# Patient Record
Sex: Female | Born: 1935 | Race: White | Hispanic: No | Smoking: Never smoker
Health system: Southern US, Community
[De-identification: ages and names within clinical notes are randomized; demographics above are authoritative.]

## PROBLEM LIST (undated history)

## (undated) DIAGNOSIS — M25569 Pain in unspecified knee: Secondary | ICD-10-CM

## (undated) DIAGNOSIS — M19049 Primary osteoarthritis, unspecified hand: Secondary | ICD-10-CM

## (undated) DIAGNOSIS — E785 Hyperlipidemia, unspecified: Secondary | ICD-10-CM

## (undated) DIAGNOSIS — M47817 Spondylosis without myelopathy or radiculopathy, lumbosacral region: Secondary | ICD-10-CM

## (undated) DIAGNOSIS — I350 Nonrheumatic aortic (valve) stenosis: Secondary | ICD-10-CM

## (undated) DIAGNOSIS — R002 Palpitations: Secondary | ICD-10-CM

## (undated) DIAGNOSIS — M47816 Spondylosis without myelopathy or radiculopathy, lumbar region: Secondary | ICD-10-CM

## (undated) DIAGNOSIS — C50919 Malignant neoplasm of unspecified site of unspecified female breast: Secondary | ICD-10-CM

## (undated) DIAGNOSIS — R011 Cardiac murmur, unspecified: Secondary | ICD-10-CM

## (undated) DIAGNOSIS — I10 Essential (primary) hypertension: Secondary | ICD-10-CM

## (undated) HISTORY — DX: Spondylosis without myelopathy or radiculopathy, lumbar region: M47.816

## (undated) HISTORY — PX: APPENDECTOMY: SHX54

## (undated) HISTORY — PX: HIATAL HERNIA REPAIR: SHX195

## (undated) HISTORY — DX: Essential (primary) hypertension: I10

## (undated) HISTORY — PX: CHOLECYSTECTOMY: SHX55

## (undated) HISTORY — DX: Pain in unspecified knee: M25.569

## (undated) HISTORY — PX: SPINE SURGERY: SHX786

## (undated) HISTORY — DX: Nonrheumatic aortic (valve) stenosis: I35.0

## (undated) HISTORY — PX: TONSILLECTOMY: SUR1361

## (undated) HISTORY — DX: Spondylosis without myelopathy or radiculopathy, lumbosacral region: M47.817

## (undated) HISTORY — DX: Primary osteoarthritis, unspecified hand: M19.049

## (undated) HISTORY — PX: BREAST SURGERY: SHX581

## (undated) HISTORY — DX: Cardiac murmur, unspecified: R01.1

## (undated) HISTORY — DX: Hyperlipidemia, unspecified: E78.5

## (undated) HISTORY — PX: ABDOMINAL HYSTERECTOMY: SHX81

## (undated) HISTORY — DX: Palpitations: R00.2

---

## 1998-01-30 ENCOUNTER — Inpatient Hospital Stay (HOSPITAL_COMMUNITY): Admission: RE | Admit: 1998-01-30 | Discharge: 1998-02-01 | Payer: Self-pay | Admitting: Neurosurgery

## 2000-10-19 ENCOUNTER — Ambulatory Visit (HOSPITAL_COMMUNITY): Admission: RE | Admit: 2000-10-19 | Discharge: 2000-10-19 | Payer: Self-pay | Admitting: Neurosurgery

## 2000-11-02 ENCOUNTER — Ambulatory Visit (HOSPITAL_COMMUNITY): Admission: RE | Admit: 2000-11-02 | Discharge: 2000-11-02 | Payer: Self-pay | Admitting: Neurosurgery

## 2000-11-11 ENCOUNTER — Encounter: Payer: Self-pay | Admitting: Family Medicine

## 2000-11-11 ENCOUNTER — Ambulatory Visit (HOSPITAL_COMMUNITY): Admission: RE | Admit: 2000-11-11 | Discharge: 2000-11-11 | Payer: Self-pay | Admitting: Cardiology

## 2001-01-31 ENCOUNTER — Ambulatory Visit (HOSPITAL_COMMUNITY): Admission: RE | Admit: 2001-01-31 | Discharge: 2001-01-31 | Payer: Self-pay | Admitting: Family Medicine

## 2001-01-31 ENCOUNTER — Encounter: Payer: Self-pay | Admitting: Family Medicine

## 2001-08-23 ENCOUNTER — Encounter: Admission: RE | Admit: 2001-08-23 | Discharge: 2001-08-23 | Payer: Self-pay | Admitting: Neurosurgery

## 2001-09-21 ENCOUNTER — Encounter: Admission: RE | Admit: 2001-09-21 | Discharge: 2001-09-21 | Payer: Self-pay | Admitting: Neurosurgery

## 2001-10-06 ENCOUNTER — Encounter: Admission: RE | Admit: 2001-10-06 | Discharge: 2001-10-06 | Payer: Self-pay | Admitting: Neurosurgery

## 2002-01-19 ENCOUNTER — Ambulatory Visit (HOSPITAL_COMMUNITY): Admission: RE | Admit: 2002-01-19 | Discharge: 2002-01-19 | Payer: Self-pay | Admitting: Ophthalmology

## 2002-02-05 ENCOUNTER — Ambulatory Visit (HOSPITAL_COMMUNITY): Admission: RE | Admit: 2002-02-05 | Discharge: 2002-02-05 | Payer: Self-pay | Admitting: Family Medicine

## 2002-02-05 ENCOUNTER — Encounter: Payer: Self-pay | Admitting: Family Medicine

## 2002-03-20 ENCOUNTER — Ambulatory Visit (HOSPITAL_COMMUNITY): Admission: RE | Admit: 2002-03-20 | Discharge: 2002-03-20 | Payer: Self-pay | Admitting: Ophthalmology

## 2002-05-01 ENCOUNTER — Encounter: Admission: RE | Admit: 2002-05-01 | Discharge: 2002-05-01 | Payer: Self-pay | Admitting: Neurosurgery

## 2002-05-15 ENCOUNTER — Encounter: Admission: RE | Admit: 2002-05-15 | Discharge: 2002-05-15 | Payer: Self-pay | Admitting: Neurosurgery

## 2002-05-21 ENCOUNTER — Ambulatory Visit (HOSPITAL_COMMUNITY): Admission: RE | Admit: 2002-05-21 | Discharge: 2002-05-21 | Payer: Self-pay | Admitting: Internal Medicine

## 2002-08-17 ENCOUNTER — Encounter: Payer: Self-pay | Admitting: Radiology

## 2002-08-17 ENCOUNTER — Encounter: Admission: RE | Admit: 2002-08-17 | Discharge: 2002-08-17 | Payer: Self-pay | Admitting: Radiology

## 2002-08-17 ENCOUNTER — Encounter: Admission: RE | Admit: 2002-08-17 | Discharge: 2002-08-17 | Payer: Self-pay | Admitting: Neurosurgery

## 2002-09-10 ENCOUNTER — Ambulatory Visit (HOSPITAL_COMMUNITY): Admission: RE | Admit: 2002-09-10 | Discharge: 2002-09-10 | Payer: Self-pay | Admitting: Ophthalmology

## 2003-01-21 ENCOUNTER — Encounter: Payer: Self-pay | Admitting: Radiology

## 2003-01-21 ENCOUNTER — Encounter: Admission: RE | Admit: 2003-01-21 | Discharge: 2003-01-21 | Payer: Self-pay | Admitting: Neurosurgery

## 2003-05-30 ENCOUNTER — Encounter
Admission: RE | Admit: 2003-05-30 | Discharge: 2003-08-28 | Payer: Self-pay | Admitting: Physical Medicine & Rehabilitation

## 2003-06-03 ENCOUNTER — Encounter (HOSPITAL_COMMUNITY)
Admission: RE | Admit: 2003-06-03 | Discharge: 2003-07-03 | Payer: Self-pay | Admitting: Physical Medicine & Rehabilitation

## 2003-07-04 ENCOUNTER — Encounter (HOSPITAL_COMMUNITY)
Admission: RE | Admit: 2003-07-04 | Discharge: 2003-08-03 | Payer: Self-pay | Admitting: Physical Medicine & Rehabilitation

## 2003-10-18 ENCOUNTER — Encounter
Admission: RE | Admit: 2003-10-18 | Discharge: 2004-01-16 | Payer: Self-pay | Admitting: Physical Medicine & Rehabilitation

## 2004-01-17 ENCOUNTER — Encounter
Admission: RE | Admit: 2004-01-17 | Discharge: 2004-04-16 | Payer: Self-pay | Admitting: Physical Medicine & Rehabilitation

## 2004-05-14 ENCOUNTER — Inpatient Hospital Stay (HOSPITAL_COMMUNITY): Admission: EM | Admit: 2004-05-14 | Discharge: 2004-05-18 | Payer: Self-pay | Admitting: Internal Medicine

## 2004-07-02 ENCOUNTER — Encounter
Admission: RE | Admit: 2004-07-02 | Discharge: 2004-09-30 | Payer: Self-pay | Admitting: Physical Medicine & Rehabilitation

## 2004-07-06 ENCOUNTER — Ambulatory Visit: Payer: Self-pay | Admitting: Physical Medicine & Rehabilitation

## 2004-09-17 ENCOUNTER — Ambulatory Visit: Payer: Self-pay | Admitting: Physical Medicine & Rehabilitation

## 2004-11-18 ENCOUNTER — Encounter
Admission: RE | Admit: 2004-11-18 | Discharge: 2005-02-16 | Payer: Self-pay | Admitting: Physical Medicine & Rehabilitation

## 2004-11-19 ENCOUNTER — Ambulatory Visit: Payer: Self-pay | Admitting: Physical Medicine & Rehabilitation

## 2004-12-22 ENCOUNTER — Ambulatory Visit (HOSPITAL_COMMUNITY): Admission: RE | Admit: 2004-12-22 | Discharge: 2004-12-22 | Payer: Self-pay | Admitting: Family Medicine

## 2005-01-01 ENCOUNTER — Ambulatory Visit (HOSPITAL_COMMUNITY): Admission: RE | Admit: 2005-01-01 | Discharge: 2005-01-01 | Payer: Self-pay | Admitting: Family Medicine

## 2005-01-14 ENCOUNTER — Ambulatory Visit: Payer: Self-pay | Admitting: Physical Medicine & Rehabilitation

## 2005-04-13 ENCOUNTER — Ambulatory Visit: Payer: Self-pay | Admitting: Internal Medicine

## 2005-05-05 ENCOUNTER — Encounter
Admission: RE | Admit: 2005-05-05 | Discharge: 2005-08-03 | Payer: Self-pay | Admitting: Physical Medicine & Rehabilitation

## 2005-05-05 ENCOUNTER — Ambulatory Visit: Payer: Self-pay | Admitting: Physical Medicine & Rehabilitation

## 2005-07-04 ENCOUNTER — Emergency Department (HOSPITAL_COMMUNITY): Admission: EM | Admit: 2005-07-04 | Discharge: 2005-07-04 | Payer: Self-pay | Admitting: Emergency Medicine

## 2005-08-23 ENCOUNTER — Ambulatory Visit: Payer: Self-pay | Admitting: Physical Medicine & Rehabilitation

## 2005-08-23 ENCOUNTER — Encounter
Admission: RE | Admit: 2005-08-23 | Discharge: 2005-11-21 | Payer: Self-pay | Admitting: Physical Medicine & Rehabilitation

## 2005-10-11 ENCOUNTER — Encounter (INDEPENDENT_AMBULATORY_CARE_PROVIDER_SITE_OTHER): Payer: Self-pay | Admitting: Diagnostic Radiology

## 2005-10-11 ENCOUNTER — Encounter (INDEPENDENT_AMBULATORY_CARE_PROVIDER_SITE_OTHER): Payer: Self-pay | Admitting: *Deleted

## 2005-10-11 ENCOUNTER — Encounter: Admission: RE | Admit: 2005-10-11 | Discharge: 2005-10-11 | Payer: Self-pay | Admitting: General Surgery

## 2005-10-21 ENCOUNTER — Encounter: Admission: RE | Admit: 2005-10-21 | Discharge: 2005-10-21 | Payer: Self-pay | Admitting: General Surgery

## 2005-10-25 ENCOUNTER — Ambulatory Visit (HOSPITAL_BASED_OUTPATIENT_CLINIC_OR_DEPARTMENT_OTHER): Admission: RE | Admit: 2005-10-25 | Discharge: 2005-10-25 | Payer: Self-pay | Admitting: General Surgery

## 2005-10-25 ENCOUNTER — Encounter (INDEPENDENT_AMBULATORY_CARE_PROVIDER_SITE_OTHER): Payer: Self-pay | Admitting: Specialist

## 2006-01-27 ENCOUNTER — Encounter
Admission: RE | Admit: 2006-01-27 | Discharge: 2006-04-27 | Payer: Self-pay | Admitting: Physical Medicine & Rehabilitation

## 2006-01-28 ENCOUNTER — Ambulatory Visit: Payer: Self-pay | Admitting: Physical Medicine & Rehabilitation

## 2006-04-27 ENCOUNTER — Encounter
Admission: RE | Admit: 2006-04-27 | Discharge: 2006-07-26 | Payer: Self-pay | Admitting: Physical Medicine & Rehabilitation

## 2006-04-27 ENCOUNTER — Ambulatory Visit: Payer: Self-pay | Admitting: Physical Medicine & Rehabilitation

## 2006-06-01 ENCOUNTER — Ambulatory Visit: Payer: Self-pay | Admitting: Internal Medicine

## 2006-08-03 ENCOUNTER — Encounter
Admission: RE | Admit: 2006-08-03 | Discharge: 2006-11-01 | Payer: Self-pay | Admitting: Physical Medicine & Rehabilitation

## 2006-08-03 ENCOUNTER — Ambulatory Visit: Payer: Self-pay | Admitting: Physical Medicine & Rehabilitation

## 2006-08-08 ENCOUNTER — Encounter: Admission: RE | Admit: 2006-08-08 | Discharge: 2006-08-08 | Payer: Self-pay | Admitting: General Surgery

## 2006-09-07 ENCOUNTER — Encounter
Admission: RE | Admit: 2006-09-07 | Discharge: 2006-12-06 | Payer: Self-pay | Admitting: Physical Medicine & Rehabilitation

## 2006-09-12 ENCOUNTER — Ambulatory Visit (HOSPITAL_COMMUNITY): Admission: RE | Admit: 2006-09-12 | Discharge: 2006-09-12 | Payer: Self-pay | Admitting: Ophthalmology

## 2006-09-26 ENCOUNTER — Ambulatory Visit (HOSPITAL_COMMUNITY): Admission: RE | Admit: 2006-09-26 | Discharge: 2006-09-26 | Payer: Self-pay | Admitting: Ophthalmology

## 2006-11-03 ENCOUNTER — Ambulatory Visit: Payer: Self-pay | Admitting: Physical Medicine & Rehabilitation

## 2006-12-22 ENCOUNTER — Encounter
Admission: RE | Admit: 2006-12-22 | Discharge: 2007-03-22 | Payer: Self-pay | Admitting: Physical Medicine & Rehabilitation

## 2007-01-12 ENCOUNTER — Ambulatory Visit: Payer: Self-pay | Admitting: Physical Medicine & Rehabilitation

## 2007-02-05 IMAGING — CR DG WRIST COMPLETE 3+V*L*
4 series · 4 of 4 positions shown · non-contrast
Comparison: none

CLINICAL DATA: Fell three months ago. 
 LEFT WRIST FOUR VIEWS:
 There is generalized osteopenia.  Normal alignment.  No fracture.  Mild degenerative change in the 1st carpal metacarpal joint.  Otherwise no significant degenerative change.

[x wrist pa left]
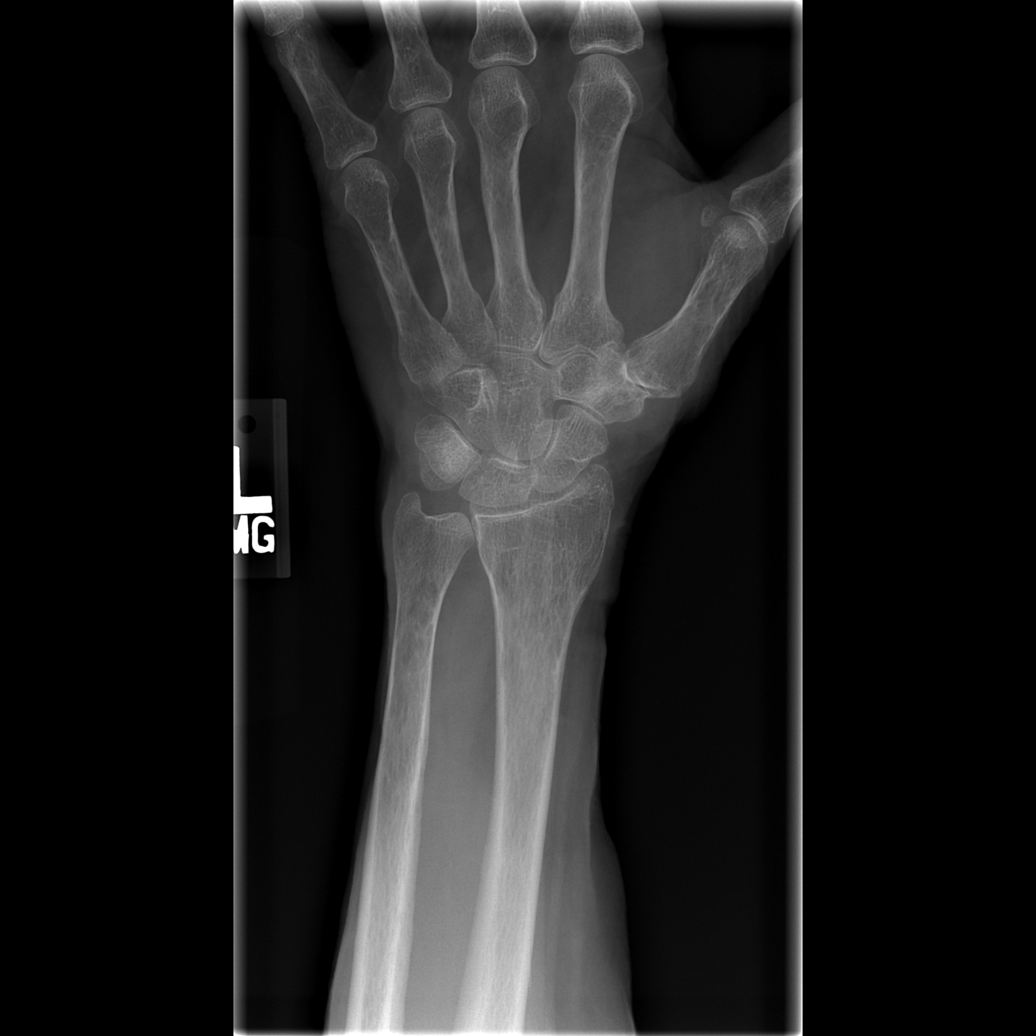

[x wrist obl left]
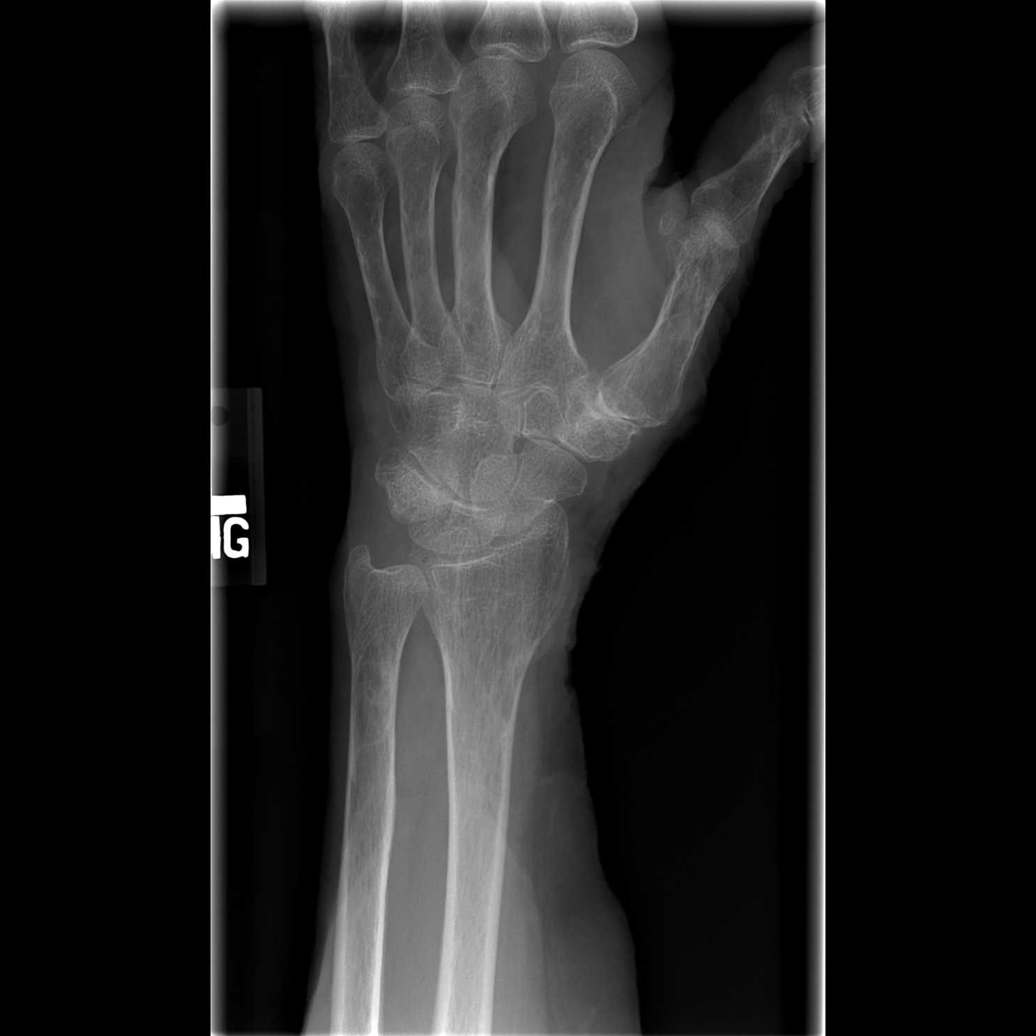

[x wrist lat left]
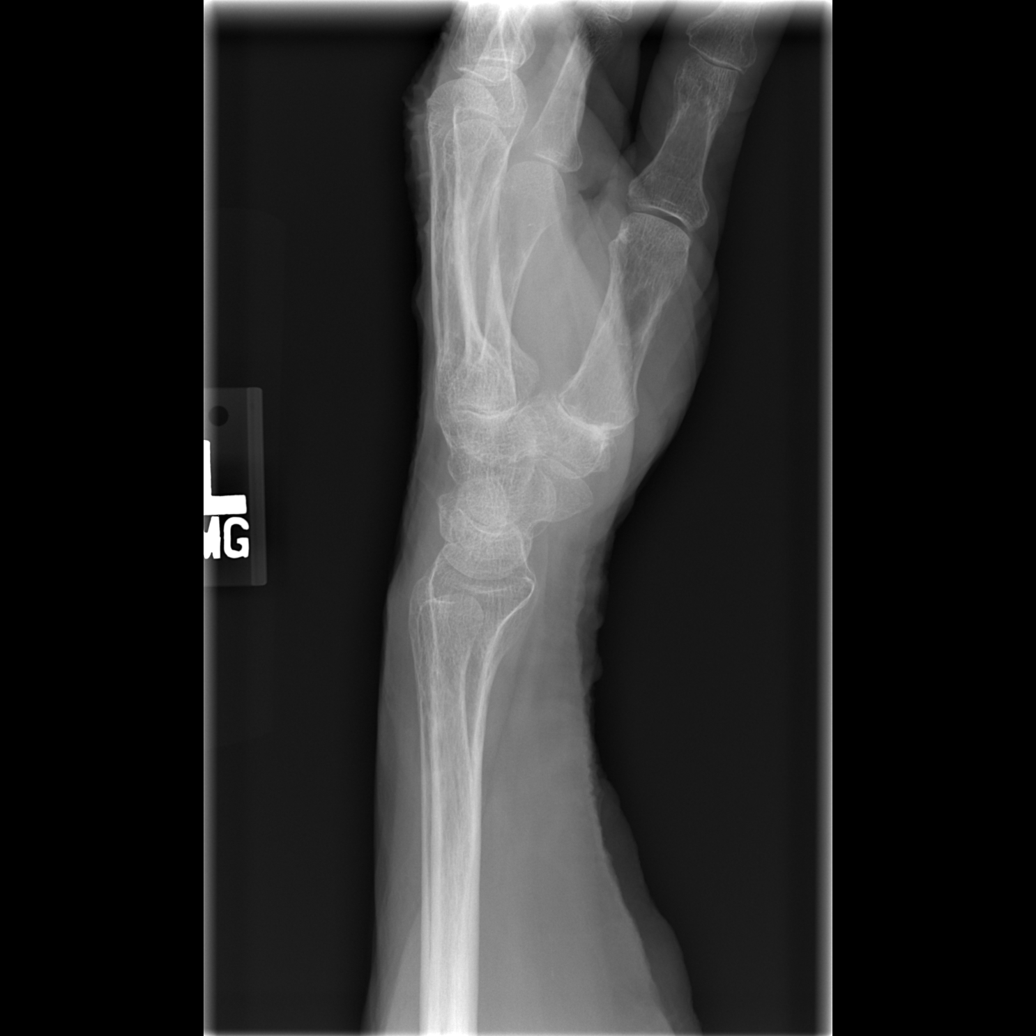

[x navicular]
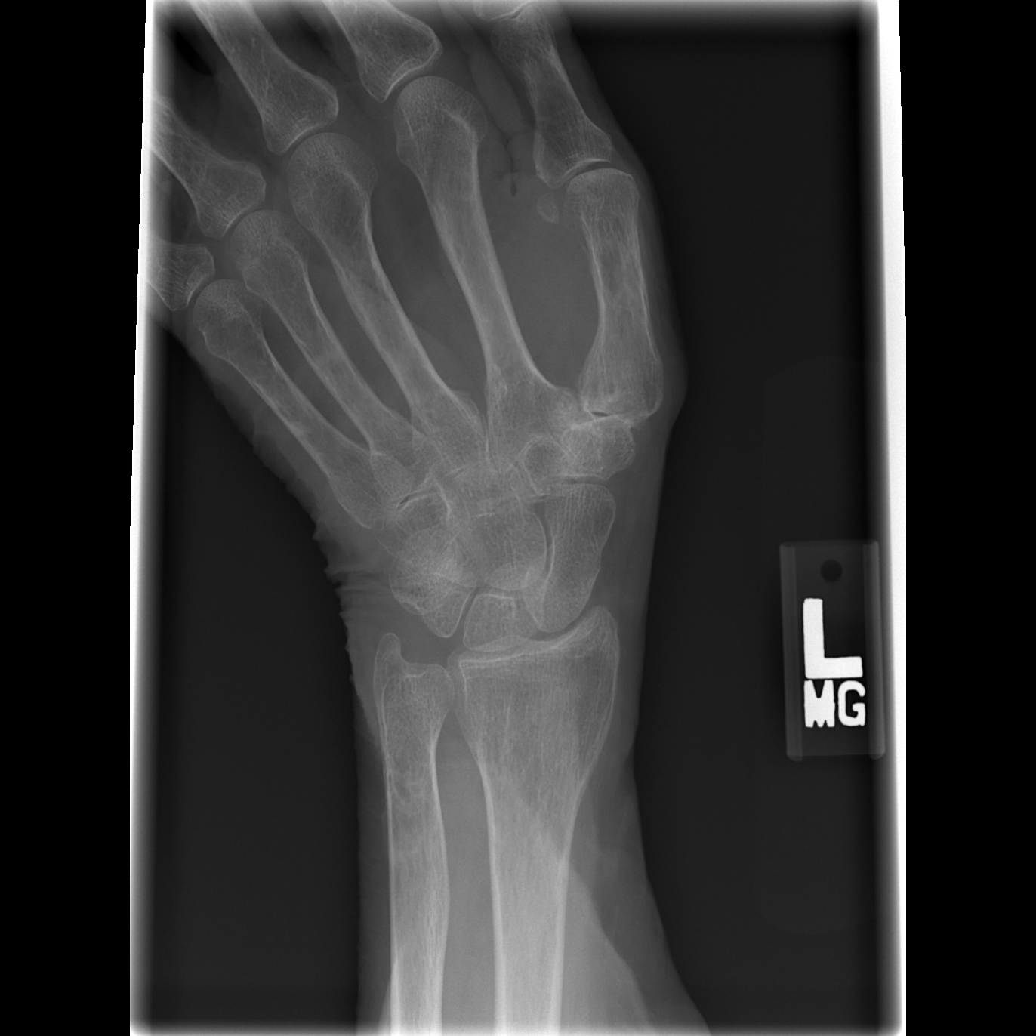

[4 of 4 positions shown; findings below may reference images not displayed]

IMPRESSION: No acute abnormality.

## 2007-03-24 ENCOUNTER — Encounter
Admission: RE | Admit: 2007-03-24 | Discharge: 2007-06-22 | Payer: Self-pay | Admitting: Physical Medicine & Rehabilitation

## 2007-03-27 ENCOUNTER — Ambulatory Visit: Payer: Self-pay | Admitting: Physical Medicine & Rehabilitation

## 2007-06-01 ENCOUNTER — Ambulatory Visit: Payer: Self-pay | Admitting: Physical Medicine & Rehabilitation

## 2007-07-27 ENCOUNTER — Encounter
Admission: RE | Admit: 2007-07-27 | Discharge: 2007-07-29 | Payer: Self-pay | Admitting: Physical Medicine & Rehabilitation

## 2007-07-27 ENCOUNTER — Ambulatory Visit: Payer: Self-pay | Admitting: Physical Medicine & Rehabilitation

## 2007-08-07 ENCOUNTER — Encounter (HOSPITAL_COMMUNITY)
Admission: RE | Admit: 2007-08-07 | Discharge: 2007-09-06 | Payer: Self-pay | Admitting: Physical Medicine & Rehabilitation

## 2007-09-21 ENCOUNTER — Encounter
Admission: RE | Admit: 2007-09-21 | Discharge: 2007-09-22 | Payer: Self-pay | Admitting: Physical Medicine & Rehabilitation

## 2007-09-21 ENCOUNTER — Ambulatory Visit: Payer: Self-pay | Admitting: Physical Medicine & Rehabilitation

## 2007-10-13 ENCOUNTER — Ambulatory Visit (HOSPITAL_COMMUNITY): Admission: RE | Admit: 2007-10-13 | Discharge: 2007-10-13 | Payer: Self-pay | Admitting: Family Medicine

## 2007-12-07 ENCOUNTER — Encounter
Admission: RE | Admit: 2007-12-07 | Discharge: 2008-03-06 | Payer: Self-pay | Admitting: Physical Medicine & Rehabilitation

## 2007-12-21 ENCOUNTER — Ambulatory Visit: Payer: Self-pay | Admitting: Physical Medicine & Rehabilitation

## 2008-01-26 ENCOUNTER — Ambulatory Visit: Payer: Self-pay | Admitting: Physical Medicine & Rehabilitation

## 2008-03-14 ENCOUNTER — Ambulatory Visit (HOSPITAL_COMMUNITY): Admission: RE | Admit: 2008-03-14 | Discharge: 2008-03-14 | Payer: Self-pay | Admitting: Family Medicine

## 2008-03-20 ENCOUNTER — Encounter: Payer: Self-pay | Admitting: Orthopedic Surgery

## 2008-03-20 ENCOUNTER — Emergency Department (HOSPITAL_COMMUNITY): Admission: EM | Admit: 2008-03-20 | Discharge: 2008-03-20 | Payer: Self-pay | Admitting: Emergency Medicine

## 2008-03-25 ENCOUNTER — Telehealth: Payer: Self-pay | Admitting: Orthopedic Surgery

## 2008-03-25 ENCOUNTER — Ambulatory Visit: Payer: Self-pay | Admitting: Orthopedic Surgery

## 2008-03-25 DIAGNOSIS — S52209A Unspecified fracture of shaft of unspecified ulna, initial encounter for closed fracture: Secondary | ICD-10-CM

## 2008-03-28 ENCOUNTER — Encounter: Payer: Self-pay | Admitting: Orthopedic Surgery

## 2008-04-10 ENCOUNTER — Encounter: Payer: Self-pay | Admitting: Orthopedic Surgery

## 2008-04-17 ENCOUNTER — Encounter
Admission: RE | Admit: 2008-04-17 | Discharge: 2008-07-12 | Payer: Self-pay | Admitting: Physical Medicine & Rehabilitation

## 2008-04-19 ENCOUNTER — Ambulatory Visit: Payer: Self-pay | Admitting: Physical Medicine & Rehabilitation

## 2008-04-22 ENCOUNTER — Ambulatory Visit: Payer: Self-pay | Admitting: Orthopedic Surgery

## 2008-05-17 ENCOUNTER — Ambulatory Visit: Payer: Self-pay | Admitting: Physical Medicine & Rehabilitation

## 2008-05-22 ENCOUNTER — Ambulatory Visit: Payer: Self-pay | Admitting: Orthopedic Surgery

## 2008-07-12 ENCOUNTER — Encounter: Payer: Self-pay | Admitting: Orthopedic Surgery

## 2008-07-12 ENCOUNTER — Ambulatory Visit: Payer: Self-pay | Admitting: Physical Medicine & Rehabilitation

## 2008-07-30 ENCOUNTER — Ambulatory Visit (HOSPITAL_COMMUNITY): Admission: RE | Admit: 2008-07-30 | Discharge: 2008-07-30 | Payer: Self-pay | Admitting: Orthopedic Surgery

## 2008-07-30 ENCOUNTER — Encounter: Payer: Self-pay | Admitting: Orthopedic Surgery

## 2008-07-31 ENCOUNTER — Ambulatory Visit: Payer: Self-pay | Admitting: Orthopedic Surgery

## 2008-07-31 DIAGNOSIS — M76899 Other specified enthesopathies of unspecified lower limb, excluding foot: Secondary | ICD-10-CM | POA: Insufficient documentation

## 2008-07-31 DIAGNOSIS — M25569 Pain in unspecified knee: Secondary | ICD-10-CM

## 2008-08-09 ENCOUNTER — Inpatient Hospital Stay (HOSPITAL_COMMUNITY): Admission: EM | Admit: 2008-08-09 | Discharge: 2008-08-13 | Payer: Self-pay | Admitting: Emergency Medicine

## 2008-08-09 ENCOUNTER — Encounter: Payer: Self-pay | Admitting: Orthopedic Surgery

## 2008-08-13 ENCOUNTER — Ambulatory Visit: Payer: Self-pay | Admitting: Orthopedic Surgery

## 2008-08-13 ENCOUNTER — Inpatient Hospital Stay: Admission: RE | Admit: 2008-08-13 | Discharge: 2008-10-28 | Payer: Self-pay | Admitting: Internal Medicine

## 2008-08-21 ENCOUNTER — Ambulatory Visit: Payer: Self-pay | Admitting: Orthopedic Surgery

## 2008-08-21 DIAGNOSIS — S52599A Other fractures of lower end of unspecified radius, initial encounter for closed fracture: Secondary | ICD-10-CM | POA: Insufficient documentation

## 2008-08-21 DIAGNOSIS — S42209A Unspecified fracture of upper end of unspecified humerus, initial encounter for closed fracture: Secondary | ICD-10-CM | POA: Insufficient documentation

## 2008-09-16 ENCOUNTER — Ambulatory Visit: Payer: Self-pay | Admitting: Orthopedic Surgery

## 2008-09-19 ENCOUNTER — Telehealth: Payer: Self-pay | Admitting: Orthopedic Surgery

## 2008-09-20 ENCOUNTER — Ambulatory Visit (HOSPITAL_COMMUNITY): Admission: RE | Admit: 2008-09-20 | Discharge: 2008-09-20 | Payer: Self-pay | Admitting: Orthopedic Surgery

## 2008-09-25 ENCOUNTER — Telehealth: Payer: Self-pay | Admitting: Orthopedic Surgery

## 2008-09-25 ENCOUNTER — Ambulatory Visit: Payer: Self-pay | Admitting: Orthopedic Surgery

## 2008-10-08 ENCOUNTER — Ambulatory Visit (HOSPITAL_COMMUNITY): Admission: RE | Admit: 2008-10-08 | Discharge: 2008-10-08 | Payer: Self-pay | Admitting: Internal Medicine

## 2008-10-29 ENCOUNTER — Telehealth: Payer: Self-pay | Admitting: Orthopedic Surgery

## 2008-11-08 ENCOUNTER — Encounter
Admission: RE | Admit: 2008-11-08 | Discharge: 2009-01-06 | Payer: Self-pay | Admitting: Physical Medicine & Rehabilitation

## 2008-11-11 ENCOUNTER — Ambulatory Visit: Payer: Self-pay | Admitting: Physical Medicine & Rehabilitation

## 2008-11-11 ENCOUNTER — Ambulatory Visit: Payer: Self-pay | Admitting: Orthopedic Surgery

## 2008-11-13 ENCOUNTER — Encounter: Payer: Self-pay | Admitting: Orthopedic Surgery

## 2008-11-16 ENCOUNTER — Ambulatory Visit (HOSPITAL_COMMUNITY): Admission: RE | Admit: 2008-11-16 | Discharge: 2008-11-16 | Payer: Self-pay | Admitting: Family Medicine

## 2008-11-30 ENCOUNTER — Encounter: Payer: Self-pay | Admitting: Orthopedic Surgery

## 2008-12-03 ENCOUNTER — Telehealth: Payer: Self-pay | Admitting: Orthopedic Surgery

## 2008-12-03 ENCOUNTER — Ambulatory Visit (HOSPITAL_COMMUNITY): Admission: RE | Admit: 2008-12-03 | Discharge: 2008-12-03 | Payer: Self-pay | Admitting: Family Medicine

## 2008-12-04 ENCOUNTER — Ambulatory Visit (HOSPITAL_COMMUNITY): Admission: RE | Admit: 2008-12-04 | Discharge: 2008-12-04 | Payer: Self-pay | Admitting: Family Medicine

## 2008-12-06 ENCOUNTER — Encounter: Payer: Self-pay | Admitting: Orthopedic Surgery

## 2008-12-11 ENCOUNTER — Encounter: Payer: Self-pay | Admitting: Orthopedic Surgery

## 2008-12-28 ENCOUNTER — Encounter: Payer: Self-pay | Admitting: Orthopedic Surgery

## 2008-12-30 ENCOUNTER — Encounter: Payer: Self-pay | Admitting: Orthopedic Surgery

## 2009-01-01 ENCOUNTER — Telehealth: Payer: Self-pay | Admitting: Orthopedic Surgery

## 2009-01-06 ENCOUNTER — Encounter: Payer: Self-pay | Admitting: Orthopedic Surgery

## 2009-01-06 ENCOUNTER — Ambulatory Visit: Payer: Self-pay | Admitting: Physical Medicine & Rehabilitation

## 2009-01-13 ENCOUNTER — Ambulatory Visit: Payer: Self-pay | Admitting: Orthopedic Surgery

## 2009-01-29 ENCOUNTER — Encounter: Payer: Self-pay | Admitting: Orthopedic Surgery

## 2009-02-05 ENCOUNTER — Encounter
Admission: RE | Admit: 2009-02-05 | Discharge: 2009-02-10 | Payer: Self-pay | Admitting: Physical Medicine & Rehabilitation

## 2009-02-07 ENCOUNTER — Encounter: Payer: Self-pay | Admitting: Orthopedic Surgery

## 2009-02-10 ENCOUNTER — Ambulatory Visit: Payer: Self-pay | Admitting: Physical Medicine & Rehabilitation

## 2009-04-02 ENCOUNTER — Ambulatory Visit (HOSPITAL_COMMUNITY): Admission: RE | Admit: 2009-04-02 | Discharge: 2009-04-02 | Payer: Self-pay | Admitting: Family Medicine

## 2009-05-05 ENCOUNTER — Ambulatory Visit: Payer: Self-pay | Admitting: Physical Medicine & Rehabilitation

## 2009-05-05 ENCOUNTER — Encounter
Admission: RE | Admit: 2009-05-05 | Discharge: 2009-05-08 | Payer: Self-pay | Admitting: Physical Medicine & Rehabilitation

## 2010-07-10 ENCOUNTER — Ambulatory Visit (HOSPITAL_COMMUNITY): Admission: RE | Admit: 2010-07-10 | Discharge: 2010-07-10 | Payer: Self-pay | Admitting: Family Medicine

## 2010-09-11 ENCOUNTER — Emergency Department (HOSPITAL_COMMUNITY)
Admission: EM | Admit: 2010-09-11 | Discharge: 2010-09-12 | Payer: Self-pay | Source: Home / Self Care | Admitting: Emergency Medicine

## 2010-09-21 LAB — URINE MICROSCOPIC-ADD ON

## 2010-09-21 LAB — BASIC METABOLIC PANEL
BUN: 22 mg/dL (ref 6–23)
CO2: 26 mEq/L (ref 19–32)
Calcium: 9.7 mg/dL (ref 8.4–10.5)
Chloride: 104 mEq/L (ref 96–112)
Creatinine, Ser: 1.03 mg/dL (ref 0.4–1.2)
GFR calc Af Amer: >60 mL/min (ref >60)
GFR calc non Af Amer: 50 mL/min — ABNORMAL LOW (ref >60)
Glucose, Bld: 111 mg/dL — ABNORMAL HIGH (ref 70–99)
Potassium: 3.6 mEq/L (ref 3.5–5.1)
Sodium: 138 mEq/L (ref 135–145)

## 2010-09-21 LAB — URINALYSIS, ROUTINE W REFLEX MICROSCOPIC
Bilirubin Urine: NEGATIVE
Hgb urine dipstick: NEGATIVE
Ketones, ur: NEGATIVE mg/dL
Nitrite: NEGATIVE
Protein, ur: NEGATIVE mg/dL
Specific Gravity, Urine: 1.015 (ref 1.005–1.030)
Urine Glucose, Fasting: NEGATIVE mg/dL
Urobilinogen, UA: 0.2 mg/dL (ref 0.0–1.0)
pH: 5.5 (ref 5.0–8.0)

## 2011-01-19 NOTE — Procedures (Signed)
NAME:  [name], [name]            ACCOUNT NO.:  [id number]   MEDICAL RECORD NO.:  [id number]           PATIENT TYPE:   LOCATION:                                 FACILITY:   PHYSICIAN:  Erick Colace, M.D.DATE OF BIRTH:  October 17, 1917   DATE OF PROCEDURE:  DATE OF DISCHARGE:                               OPERATIVE REPORT   PROCEDURE:  Left L5 dorsal ramus injection, left L4, L3, L2 medial-  branch blocks under fluoroscopic guidance.   INDICATIONS:  Lumbar spondylosis without myelopathy.  She has had good  results with right-sided injection performed at corresponding levels  done on Jan 07, 2009, with continuing relief.  She has purely left-sided  pain that interferes with self-care, mobility, averaging 6/10, is only  partially responsive to medication management.   Informed consent was obtained after describing risks and benefits of the  procedure with the patient.  This includes bleeding, bruising,  infection, temporary or permanent paralysis, and she elects to proceed  and has given written consent.  The patient placed prone on fluoroscopy  table.  Betadine prep, sterile drape.  A 25-gauge inch and half needle  was used to anesthetize the skin and subcu tissue, 1% lidocaine x2 mL.  Then, a 22-gauge 3-1/2-inch spinal needle was inserted under  fluoroscopic guidance first targeting the left S1 SAP sacral ala  junction, bone contact made, confirmed with lateral imaging.  Omnipaque  180 x0.25 mL demonstrated no intravascular uptake.  Then, 0.5 mL of a  solution containing 1 mL of 40 mg/mL dexamethasone and 1 mL of 2% MPF  lidocaine was injected.  Then, the left L5 SAP transverse process  junction targeted, bone contact made, confirmed with lateral imaging.  Omnipaque 180 under live fluoro demonstrated no intravascular uptake.  Then, 0.5 mL of the dexamethasone-lidocaine solution was injected.  Then, the left L4 SAP transverse process junction targeted, bone contact  made,  confirmed with lateral imaging.  Omnipaque 180 under live fluoro  demonstrated no intravascular uptake.  Then, 0.5 mL of the dexamethasone-  lidocaine solution injected.  Then, the left L4 SAP transverse process  junction targeted, bone contact made, confirmed with lateral imaging.  Omnipaque 180 x0.5 mL demonstrated no intravascular uptake and 0.5 mL of  the dexamethasone-lidocaine solution injected.  Then, the left L3 SAP  transverse process junction targeted, bone contact made, confirmed with  lateral imaging.  Omnipaque 180 x0.25 mL demonstrated no intravascular  uptake.  Then, 0.5 mL of the dexamethasone-lidocaine solution was  injected.  The patient tolerated the procedure well.  Pre and  postinjection vitals stable.  Postinjection instructions given.  Preinjection pain level 6/10.  Postinjection pain level 3/10.  We will  see her back in 8 weeks.  If she has recurrence of the pain on the right  side, we will perform radiofrequency neurotomy if it occurs within the  next month.      Erick Colace, M.D.  Electronically Signed     AEK/MEDQ  D:  02/10/2009 13:14:24  T:  02/11/2009 04:53:21  Job:  [id number]

## 2011-01-19 NOTE — H&P (Signed)
[name], [name] NO.:  [id number]   MEDICAL RECORD NO.:  [id number]          PATIENT TYPE:  INP   LOCATION:  A323                          FACILITY:  [facility]   PHYSICIAN:  Vickki Hearing, M.D.DATE OF BIRTH:  02/08/1918   DATE OF ADMISSION:  08/09/2008  DATE OF DISCHARGE:  LH                              HISTORY & PHYSICAL   She has a chief complaint of left shoulder pain and left wrist pain.   HISTORY:  She is an 77 year old female who fell and fractured her left  wrist, nondisplaced and has a comminuted fracture of left proximal  humerus with several fragments of the humeral head as evidenced on CAT  scan.  She has apex anterior angulation, but impaction of the humeral  head to the shaft.  She is borderline meeting near criteria for  displacement.   I have spoken her and her daughter.  I have offered them 4 options -  humeral head replacement, open treatment and internal fixation, skillful  neglect, and shoulder replacement if unhappy with the loss of flexion  and internal rotation.  They have agreed to the latter.   She has mild-to-moderate discomfort at this time.  Pain medication seems  to be controlling her pain.  She is in a splint on the left wrist, which  we will cast later and she is in a sling and swath on the left shoulder.   She has several medical issues, which include hypertension and chronic  back pain.  She has a recent left upper extremity fracture of the wrist,  I believe it was the ulna.  She has some cholesterol problems as well.   ALLERGIES:  She has no known drug allergies.   SOCIAL HISTORY:  Her daughter was a supportive relative.  She does not  smoke or drink.   REVIEW OF SYSTEMS:  She has some history of dyspepsia.  GU symptoms are  negative.  Skin, neurologic, metabolic, and hematologic symptoms are  negative.  Cardiovascular:  Symptoms negative.  Respiratory:  Symptoms  negative.  ENT:  Symptoms negative.   MEDICINES:   Nexium, Altace, tamoxifen, atenolol, Lexapro, allopurinol,  Darvocet, lorazepam, simvastatin, aspirin, vitamins, Tums, and  melatonin.   PHYSICAL EXAMINATION:  VITAL SIGNS:  A 98.6 temperature, 58 pulse, 20  respiratory rate, and 148/89 blood pressure.  GENERAL:  Overall, she looks younger than her stated age.  She is well  developed and well nourished.  Grooming and hygiene are normal.  HEENT:  She has no facial trauma.  Extraocular muscles are intact.  She  has some difficulty hearing.  NECK:  Supple.  EXTREMITIES:  She has good pulse and perfusion in her both upper  extremities.  She has a splint on the left wrist, a shoulder sling and  swath for the left shoulder.  Left shoulder has significant amount of  swelling and ecchymosis in the proximal humerus area.  There is no  evidence of gross deformity.  Wrist is aligned well.  Splint is intact.  Elbow motion is normal.  She has some discomfort in the shoulder with  that.  She has tenderness over the distal radius.  Pulses and perfusion  are again normal.  SKIN:  No evidence of lacerations.  NEUROLOGIC:  She is awake, alert, and oriented x3.  Mood and affect are  normal.   RADIOLOGICAL EVALUATION:  A CT scan of the head, which showed no acute  changes.  She had a CT scan of the shoulder, x-rays of the shoulder, and  x-rays of the wrist.  The wrist has a nondisplaced distal radius  fracture.  The CT scan of the shoulder shows a comminuted proximal  fracture with apex anterior angulation, but impaction of the head to the  shaft.   Again as discussed above, we decided to go with nonoperative treatment.   The patient will need skilled care for a couple of weeks.   She should stay in a sling and swath.  We will put a cast in left wrist.   DIAGNOSES:  1. Left proximal humerus fracture.  2. Left wrist fracture.      Vickki Hearing, M.D.  Electronically Signed     SEH/MEDQ  D:  08/10/2008  T:  08/10/2008  Job:  [id number]

## 2011-01-19 NOTE — Procedures (Signed)
[name], [name]            ACCOUNT NO.:  [id number]   MEDICAL RECORD NO.:  [id number]           PATIENT TYPE:   LOCATION:                                 FACILITY:   PHYSICIAN:  Erick Colace, M.D.DATE OF BIRTH:  01-11-1918   DATE OF PROCEDURE:  01/06/2009  DATE OF DISCHARGE:                               OPERATIVE REPORT   This is right L5 dorsal ramus injection, right L4 medial branch block,  right L3 medial branch block, right L2 medial branch block under  fluoroscopic guidance.   INDICATIONS:  Lumbar pain.  She has had good relief with medial branch  block and radiofrequency on left side now resulting right-sided pain is  similar.  Pain is only partially responsive to medication management  including narcotic analgesics and interferes with mobility.   Informed consent was obtained after describing risks and benefits of the  procedure with the patient.  These include bleeding, bruising, and  infection.  She elects to proceed and has given written consent.  The  patient was placed prone on fluoroscopy table with Betadine prep,  sterilely draped, a 25-gauge inch and half needle was used to  anesthetize skin and subcu tissue with 1% lidocaine x2 mL, and a 22-  gauge 3-1/2-inch spinal needle was inserted under fluoroscopic guidance,  first starting at the right S1 SAP transverse process junction.  Bone  contact made confirmed with lateral imaging.  Omnipaque 180 under live  fluoro demonstrated no intravascular uptake, then 0.5 mL of solution  containing 1 mL 4 mg/mL dexamethasone, and 2 mL of 2% MPF lidocaine was  injected in the left L5 SAP transverse process junction targeted, bone  contact made confirmed with lateral imaging.  Omnipaque 180 under live  fluoro demonstrated no intravascular uptake and 0.5 mL of dexamethasone  lidocaine solution was used at the left L4 SAP transverse process  junction targeted, bone contact made confirmed with lateral imaging.  Omnipaque 180 under live fluoro demonstrated no intravascular uptake,  then 0.5 mL of dexamethasone lidocaine solution was injected in the left  L4 SAP transverse process junction targeted, bone contact made confirmed  with lateral imaging.  Omnipaque 180 under live fluoro demonstrated no  intravascular uptake, 0.5 mL of dexamethasone lidocaine solution was  injected.  Left L3 SAP transverse process junction targeted, bone made  confirmed with lateral imaging.  Omnipaque 180 under live fluoro  demonstrated no intravascular uptake, 0.5 mL of dexamethasone lidocaine  solution was injected.  The patient tolerated the procedure well.  Pre  and post injection instructions given.  She has greater than 50% pain  relief on the right side.  For the low back pain, we will schedule back  in months for repeat confirm and perform radiofrequency if only a short-  term relief.      Erick Colace, M.D.  Electronically Signed     AEK/MEDQ  D:  01/06/2009 13:52:32  T:  01/07/2009 03:36:59  Job:  [id number]

## 2011-01-19 NOTE — Procedures (Signed)
[name], OSTROM NO.:  [id number]   MEDICAL RECORD NO.:  [id number]          PATIENT TYPE:  REC   LOCATION:  TPC                          FACILITY:  [facility]   PHYSICIAN:  Erick Colace, M.D.DATE OF BIRTH:  [birth year]   DATE OF PROCEDURE:  DATE OF DISCHARGE:                               OPERATIVE REPORT   Ms. [name] returns today.  She is here for a left first carpal  metacarpal injection.   INDICATION:  Osteoarthritis of the hand and wrist. The pain is only  partially responsive to medication management.   Informed consent was obtained after describing the risks and benefits of  the procedure to the patient and these include bleeding, bruising, and  infection. She elects to proceed.  The patient seated with head on exam  table.  Area marked prepped with Betadine, entered with a 27-gauge 5/8  inch needle and 1/4 mL of 40 mg per mL of Depo-Medrol and 1/4 mL of 1%  lidocaine injected. The patient tolerated the procedure well.  Post  injection instructions given.      Erick Colace, M.D.  Electronically Signed     AEK/MEDQ  D:  06/01/2007 18:42:51  T:  06/02/2007 11:40:15  Job:  [id number]

## 2011-01-19 NOTE — Assessment & Plan Note (Signed)
An 77 year old female who I last saw on Jan 26, 2008.  In the interval  time period, she has fallen and broken her left wrist.  She states that  her quad cane was twisted in the wrong direction.  She went to [facility] and was seen by Dr. Romeo Apple from Orthopedics.  She was  casted.  She was given Vicodin to take in place of her Darvocet.  She  has had no new problems other than that.  She has had only partial  relief of pain with her Vicodin.  She follows up with Dr. Romeo Apple on  Monday for recheck and x-rays.   She has had no further falls.   Pain levels are in the 6-7 range.  Sleep is poor.  She could still climb  steps.  She does not drive.  She walks 10 minutes at a time.  She needs  help with bathing, household duties, and shopping.   REVIEW OF SYSTEMS:  Positive for confusion, depression, and anxiety.   SOCIAL HISTORY:  She lives on her own, widowed.  Her daughter checks in  on her frequently.   PHYSICAL EXAMINATION:  VITAL SIGNS:  Blood pressure 147/63, pulse 66,  respiratory rate is 18, and O2 sat is 97% on room air.  GENERAL:  In no acute distress.  Orientation x3.  Affect is bright.  Gait is normal.  BACK:  Some tenderness over the left PSIS area.  Her hip internal and  external rotation is normal.  Quadriceps strength and ankle dorsiflexion  strength are all normal.  Left upper extremity is in a short arm cast.   IMPRESSION:  1. Some exacerbation of her chronic facet syndrome.  She may, in      addition, have some sacroiliac symptoms.  We will wait another      month before deciding on any further treatment or workup because      this could just be a flare-up due to the fall.  2. Left wrist pain due to fracture.  While she is under pain contract,      I think it is reasonable for her orthopedist to prescribe      hydrocodone for her, but in the interval time I will not prescribe      any Darvocet.   PLAN:  1. I will see her back in 1 month to reassess,  consider SI injection.  2. I have advised her to use a walker that she already has at home      from now on, and she may need another PT balance program once she      gets healed up from her fracture.      Erick Colace, M.D.  Electronically Signed     AEK/MedQ  D:  04/19/2008 13:36:23  T:  04/20/2008 05:10:17  Job #:  [id number]   cc:   Vickki Hearing, M.D.  Fax: [phone]

## 2011-01-19 NOTE — Procedures (Signed)
NAMEADRIAN, SPECHT NO.:  [id number]   MEDICAL RECORD NO.:  [id number]         PATIENT TYPE:  [facility]   LOCATION:                                 FACILITY:   PHYSICIAN:  Erick Colace, M.D.DATE OF BIRTH:  1918/04/02   DATE OF PROCEDURE:  07/12/2008  DATE OF DISCHARGE:                               OPERATIVE REPORT   Ms. Pardue is an 77 year old female who I last saw on May 17, 2008.  She has a history of lumbar spondylosis and has responded well to  radiofrequency neurotomy performed on December 21, 2007.  She hurt her left  knee during her fall a couple of months ago.  She had left wrist  fracture as well.  She is now noting some right knee pain as well, and  she is not sure whether she actually injured this with her fall in  August or whether this is a new problem.  She feels like clicks.  She  has pain when she tried to straighten her knee.  She is living  independently still her daughter checks up on her frequently.  Her  Oswestry disability scale is 56%.   Pain level is 8/10 mainly in the knee at this point and to a lesser  degree in her back.  She uses a cane to ambulate.  She climbs steps.  She does not drive anymore.   REVIEW OF SYSTEMS:  Problems with trouble walking, dyspnea, depression,  anxiety.   CURRENT MEDICATIONS:  Darvocet-N 100 one p.o. t.i.d.   PHYSICAL EXAMINATION:  VITAL SIGNS:  Blood pressure is 172/84, pulse 52,  respiratory rate 16, O2 sat 98% on room air.  GENERAL:  Elderly female, overweight in no acute distress.  EXTREMITIES:  Without edema.  No evidence of knee effusion or erythema.  She has pain with extension of her knee.  She has pain along the medial  and lateral joint line.  It is more tender on the lateral joint line,  however.  She ambulates with a limp favoring the left lower extremity.  Deep tendon reflexes are normal.  Sensation normal.  Her hip range of  motion is normal.  Ankle range of motion is normal as  well.   IMPRESSION:  1. Right knee contusion, question whether she may have meniscal tear.      In either case, this is limiting her activity level.  We will go      ahead and inject it today.  2. Referred to Ortho, Dr. Romeo Apple who saw her for left wrist problem,      to see if she may have a loose body or meniscal tear in her right      knee.      Erick Colace, M.D.  Electronically Signed     AEK/MEDQ  D:  07/12/2008 16:54:55  T:  07/13/2008 05:13:44  Job:  [id number]   cc:   Patrica Duel, M.D.  Fax: [phone]   Vickki Hearing, M.D.  Fax: [phone]

## 2011-01-19 NOTE — Group Therapy Note (Signed)
[name], BIAS            ACCOUNT NO.:  [id number]   MEDICAL RECORD NO.:  [id number]          PATIENT TYPE:  INP   LOCATION:  A323                          FACILITY:  [facility]   PHYSICIAN:  Osvaldo Shipper, MD     DATE OF BIRTH:  Jul 10, 1918   DATE OF PROCEDURE:  08/13/2008  DATE OF DISCHARGE:                                 PROGRESS NOTE   SUBJECTIVE:  Patient is feeling better today.  She denies any complaints  although she is little bit concerned about her left upper arm getting  swollen.  Denies any dizziness, lightheadedness.   OBJECTIVE:  VITAL SIGNS:  Show that her temperature is 98.6, heart rate  72, respiratory rate 18, blood pressure this morning was 139/105, at  4:00 it was 157/75, saturation 96% on room air.  GENERAL:  Elderly white female in no distress.  HEENT:  No pallor, no icterus.  Oral mucous membranes moist.  No oral  lesions are noted.  NECK:  Soft and supple.  No thyromegaly is present.  LUNGS:  Clear to auscultation anteriorly.  CARDIOVASCULAR:  S1, S2 normal regular.  No murmurs appreciated.  No S3,  S4, no rubs, no bruits.  ABDOMEN:  Soft, nontender, nondistended.  Bowel sounds are present.  No  masses or organomegaly is appreciated.  NEUROLOGICALLY:  She is alert and oriented x3.  No focal neurological  deficits are present.  MUSCULOSKELETAL:  Left arm was inspected and it does have evidence for  swelling and bruising.  I had noted this yesterday.  The patient does  have peripheral brachial pulses though somewhat faint.   LABORATORY DATA:  No labs have been done today.   ASSESSMENT/PLAN:  1. Left upper arm swelling and bruising.  I will check a stat CBC on      her.  Dr. Romeo Apple also to evaluate this arm as the patient may      have had bleeding inside.  I will hold on her heparin as well and      defer to Dr. Romeo Apple for further management of this issue.  2. Urinary tract infection, she has a history of recurrent urinary      tract infection and  is under evaluation by Dr. Dennie Maizes,      urologist.  She was supposed to see him yesterday in his office.      However, since she was hospitalized, she could not see him.  Her      urine cultures are not growing any organism at this time.  3. Hyponatremia had improved leukocytosis.  We think the reason for      leukocytosis is urinary tract infection.  I had planned to recheck      her white count in a week after antibiotic therapy.  However, we      will do the stat CBC today and we will follow up on that as well.  4. She has left humeral and wrist fractures and we defer to Dr.      Romeo Apple for further evaluation.  The patient has opted not to  undergo surgery at this time.   Her other medical issues, including hypertension, GERD depression,  osteoporosis dyslipidemia and a history of breast cancer all of them a  stable.   Plan is to obtain a stat CBC and to have the left upper arm evaluated by  orthopedic doctor.      Osvaldo Shipper, MD  Electronically Signed     GK/MEDQ  D:  08/13/2008  T:  08/13/2008  Job:  [id number]   cc:   Vickki Hearing, M.D.  Fax: [phone]

## 2011-01-19 NOTE — Procedures (Signed)
[name], [name]            ACCOUNT NO.:  [id number]   MEDICAL RECORD NO.:  [id number]          PATIENT TYPE:  REC   LOCATION:  TPC                          FACILITY:  [facility]   PHYSICIAN:  Erick Colace, M.D.DATE OF BIRTH:  [birth year]   DATE OF PROCEDURE:  04/27/2007  DATE OF DISCHARGE:                               OPERATIVE REPORT   PROCEDURE:  Left L5 dorsal ramus injection and radiofrequency neurotomy,  left L4 medial branch block radiofrequency neurotomy, left L3 medial  branch block radiofrequency neurotomy under fluoroscopic guidance.   INDICATIONS:  Lumbar facet mediated pain improved x2 with medial branch  blocks, the pain is worse on the left side than on the right side.  The  pain is only partially responsive to medication management including  narcotic analgesics.   DESCRIPTION OF PROCEDURE:  Informed consent was obtained after  describing risks and benefits to the patient.  These include bleeding,  bruising, infection, loss of bowel and bladder function, temporary or  permanent paralysis.  She elected to proceed and has given written  consent. The patient was placed prone on the fluoroscopy table, Betadine  prep, sterile drape.  A 25 gauge 1 1/2 inch needle was used to incise  the skin and subcu tissue with 1% lidocaine x2 mL.  A 20 gauge 10 cm RF  needle with 10 mm curved active tip was inserted under fluoroscopic  guidance targeting the left S1 SAP sacral ala junction, bone contact  made, confirmed with lateral imaging.  Motor stim x3 volts demonstrated  no lower extremity twitch followed by injection of 1 mL of a solution  containing 1 mL of 4 mg/mL dexamethasone and 2 mL of 1% MPF lidocaine  followed by RF lesioning 7 degrees x 70 seconds.  Then, the left L5 SAP  transverse process junction targeted, bone contact made, confirmed with  lateral imaging. Motor stim x3 volts demonstrated no lower extremity  twitch, followed by injection of 1 mL of the  dexamethasone lidocaine  solution, and RF lesioning 7 degrees x 70 seconds.  Next, the left L4  SAP transverse process junction targeted, bone contact made, confirmed  with lateral imaging.  Motor stim x3 volts demonstrated no lower  extremity twitch followed by injection of 1 mL of dexamethasone  lidocaine solution and RF lesioning 7 degrees x 70 seconds.  The patient  tolerated the procedure well.   Post injection instructions given.  Pre and post injection vitals  stable.  Pre-injection pain level 7/10, post injection 4/10.  She will  see me back in one month for follow up on effects.  If her pain is  primarily right side at that time, we will then schedule for RF on the  right side.  She also notes some increasing pain again in her carpal  metacarpal and will likely need injection next month for that.      Erick Colace, M.D.  Electronically Signed     AEK/MEDQ  D:  04/27/2007 16:03:31  T:  04/28/2007 17:24:29  Job:  [id number]

## 2011-01-19 NOTE — Procedures (Signed)
[name], [name] NO.:  [id number]   MEDICAL RECORD NO.:  [id number]          PATIENT TYPE:  REC   LOCATION:  [facility]PC                          FACILITY:  [facility]   PHYSICIAN:  Erick Colace, M.D.DATE OF BIRTH:  02/27/1918   DATE OF PROCEDURE:  01/12/2007  DATE OF DISCHARGE:                               OPERATIVE REPORT   PROCEDURE:  Bilateral lumbar facet injection.   INDICATIONS:  Lumbar axial pain only partially relieved with narcotic  analgesic medications. She has had relief of her radicular pain with S1  transforaminal injections. She has not had lumbar facet injections.  She  is not on any blood thinners, anti-inflammatories, or antibiotics.   Informed consent was obtained.  The patient's daughter was present for  this, as well.  I described the risks and benefits of the procedure  including bleeding, bruising, infection, loss of bowel and bladder  function, temporary or permanent paralysis.  She elects to proceed and  has given written consent.   The patient was placed prone on the fluoroscopy table.  Betadine prep  and sterile drape.  25 gauge 1 1/2 inch needle was used to anesthetize  the skin and subcu tissue with 1% lidocaine x2 mL.  Then, the 25 gauge 3  1/2 inch spinal needle was inserted under fluoroscopic guidance, first  targeting the left S1 SAP sacral ala junction. Bone contact made,  confirmed with lateral imaging. Omnipaque 180 x 0.5 mL demonstrated no  intravascular uptake and 0.5 mL of the Depo-Medrol lidocaine solution  was injected.  Then, the right S1 SAP sacral ala junction targeted, bone  contact made, confirmed with lateral imaging.  Omnipaque 180 x 0.5 mL  demonstrated no intravascular uptake followed by injection of 0.5 mL of  the Depo-Medrol lidocaine solution.  Then, the right L5 SAP transverse  process junction targeted, bone contact made, confirmed with lateral  imaging.  Omnipaque 20 x 0.5 mL demonstrated no  intravascular uptake and  0.5 mL of the Depo-Medrol lidocaine solution injected.  Then, the right  L4 SAP transverse junction targeted, bone contact made, confirmed with  lateral imaging.  Omnipaque 180 x 0.5 mL demonstrated no intravascular  uptake then 0.5 mL Depo-Medrol lidocaine solution was injected.  Then,  left L4 SAP transverse junction targeted, bone contact made, confirmed  with lateral imaging.  Omnipaque 180 x 0.5 mL demonstrated no  intravascular uptake then 0.5 mL of Depo-Medrol lidocaine solution  injected.  Then, the left L5 SAP transverse junction targeted, bone  contact made, confirmed with lateral imaging.  Omnipaque 180 x 0.5 mL  demonstrated no intravascular uptake. Then, 0.5 mL of Depo-Medrol  lidocaine solution injected. The patient tolerated the procedure well.  Pre-injection pain level 8/10.  Post injection pain level 3/10.  I will  see her back in three weeks with reinjection at that time.  If needed,  she can increase her Darvocet to 2 tabs b.i.d.      Erick Colace, M.D.  Electronically Signed     AEK/MEDQ  D:  01/12/2007 16:23:43  T:  01/12/2007 18:44:58  Job:  [id number]

## 2011-01-19 NOTE — Group Therapy Note (Signed)
NAMEMAYLA, BIDDY NO.:  [id number]   MEDICAL RECORD NO.:  [id number]          PATIENT TYPE:  INP   LOCATION:  A323                          FACILITY:  [facility]   PHYSICIAN:  Vickki Hearing, M.D.DATE OF BIRTH:  06-06-1918   DATE OF PROCEDURE:  DATE OF DISCHARGE:                                 PROGRESS NOTE   She has a proximal left humerus fracture, which we decided to treat  nonoperatively.  She will need skilled nursing care.  She is in stable  condition.  Pain is controlled.  I adjusted her sling.  She should be  with the head of the bed elevated about 45 degrees or higher.  She  should be bed-to-chair.      Vickki Hearing, M.D.  Electronically Signed     SEH/MEDQ  D:  08/11/2008  T:  08/11/2008  Job:  [id number]

## 2011-01-19 NOTE — Assessment & Plan Note (Signed)
This 77 year old female returns today.  I last saw her on July 12, 2008.  She has had a recent fall and injured her right knee, underwent  knee injection which helped; however, she had another fall and had what  sounds like a left humeral fracture as well as left wrist fracture.  She  states that she was reaching for an object and she lost her balance.  She ended up going to skilled nursing facility for a couple of months  and is now at home again.  While in the skilled nursing facility, she  was on hydrocodone, now she is back on her propoxyphene.  Her Oswestry  disability index score is 58%.  Her back pain is slightly worse when  compared to last November, but this is mainly intermittent in nature.  Her sleep is fair.  Her pain is worse with walking and bending, improves  with rest, medications, and injections.  Her relief from meds is fair.  She can walk without assistance.  She needs assistance with household  duties and shopping.   Her review of systems is positive for dizziness, confusion, depression,  anxiety, which are chronic.   PHYSICAL EXAMINATION:  Blood pressure 144 98, pulse 66, respirations 18,  and O2 sat 96% on room air.  Elderly female in no acute distress.  Orientation x3.  Affect is alert.  Gait is shuffled.  Her extremities  show no evidence edema.  She has reduced lower extremity reflex 1+ at  the knees and ankles.  Motor strength is 5-/5 in bilateral hip flexion,  knee extension, ankle dorsiflexion.  Her back has some mild tenderness  to palpation in the lumbosacral junction.   IMPRESSION:  1. Lumbar postlaminectomy syndrome, lumbar spondylosis without      myelopathy.  2. Frequent falls.  I believe this is multifactorial; however, her      Darvocet may be a contributory factor and therefore we will switch      her to tramadol 50 t.i.d.  3. I will see her back in about 2 months that will be about the 1-year      point status post her radiofrequency  neuropathy.  If she starts      getting some recurrence, we will repeat radiofrequency neuropathy.      Erick Colace, M.D.  Electronically Signed     AEK/MedQ  D:  11/11/2008 16:25:40  T:  11/12/2008 03:43:32  Job #:  [id number]

## 2011-01-19 NOTE — Discharge Summary (Signed)
[name], [name]            ACCOUNT NO.:  [id number]   MEDICAL RECORD NO.:  [id number]          PATIENT TYPE:  INP   LOCATION:  A323                          FACILITY:  [facility]   PHYSICIAN:  Vickki Hearing, M.D.DATE OF BIRTH:  1918-02-16   DATE OF ADMISSION:  08/09/2008  DATE OF DISCHARGE:  [facility]                               DISCHARGE SUMMARY   DATE OF DISCHARGE:  To be determined, most likely will be December 7.   ADMITTING DIAGNOSIS:  Left proximal humerus fracture.   DISCHARGE DIAGNOSIS:  Left proximal humerus fracture.   PROCEDURE:  None.   ADMITTING PHYSICIAN:  Dr. Romeo Apple.   DISCHARGING PHYSICIAN:  Dr. Romeo Apple.   HISTORY:  This is an 77 year old female who fell and fractured her left  proximal humerus.  She had a CT scan in the emergency room which showed  that two fragments were impacted but proximal right was comminuted.  The  patient was admitted in a sling.  She also had a left distal radius  fracture which was nondisplaced and she was placed in a splint.   She has a history of hypertension, gout, hypercholesterolemia and  history of dyspnea.   HOSPITAL COURSE:  The patient was admitted in a sling and a splint on  her left upper extremity.  We discussed the treatment options that  included surgical versus nonsurgical versus delayed surgical treatment  and they opted for delayed surgical treatment if needed if pain or  dysfunction of the left upper extremity should persist.  The going plan  at this point is 3 weeks in the sling and swath, 6 weeks in the  immobilization of the left wrist and then aggressive physical therapy.   MEDICATION LIST:  1. Norvasc 5 mg daily.  2. Tenormin 12.5 mg daily.  3. OsCal 500 mg daily.  4. Vitamin D 1000 units daily.  5. Cipro 500 mg p.o. q.8 hours stop date December 15.  6. Colace 100 mg b.i.d.  7. Lexapro 10 mg daily.  8. Milk of magnesia 30 mL oral daily.  9. Protonix 80 mg p.o. b.i.d. WC.  10.Altace 5 mg daily.  11.Zocor 40 mg daily.  12.Tamoxifen 20 mg daily.  13.Ativan 0.5 mg q.h.s. p.r.n.   ALLERGIES:  DOXYCYCLINE, REGLAN, NITROFURANTOIN.   DISPOSITION:  To skilled nursing facility.  Overall condition stable.   FOLLOW-UP VISIT:  One week x-ray proximal left humerus.   ADDITIONAL INSTRUCTIONS:  The head of bed should be elevated to 45  degrees or higher, should not go below 45 degrees.  Sling stays on at  all times as does the splint.      Vickki Hearing, M.D.  Electronically Signed     SEH/MEDQ  D:  08/13/2008  T:  08/13/2008  Job:  [id number]

## 2011-01-19 NOTE — Assessment & Plan Note (Signed)
77 year old female returns after I last saw her July 28, 2007.  We sent her to [facility]  She got a thumb spica  splint, custom molded, which has helped with her left thumb pain.  She  still needs some adjustment due to some discomfort in the forearm area,  but has not made it back there yet.  She is still on reduced dosage of  the Darvocet,  i.e., three tablets per day rather than four and has been  on a lower dose since her lumbar radiofrequency neurotomy.  She has had  no falls.  She has had no memory lapses, no drowsiness, no other  significant side effects from the medication.  She was started on a  small dose of lorazepam per her primary physician, and I did caution her  to be careful when taking that in combination with Darvocet given her  age and fall risk.   Her pain level is graded at 4/10 currently and improves with activity in  the 3-5 range.  Sleep is fair.  Pain is worse with walking, bending,  standing.  Walk 10 minutes at a time.  She climbs steps.  She is  retired, needs some assistance with household duties and shopping.  Her  daughter is quite helpful in that regard.  She has had some problems  with confusion, anxiety and depression.  No suicidal thoughts   PHYSICAL EXAMINATION:  VITAL SIGNS:  Blood pressure:  156/62.  Pulse:  59.  Respirations:  18.  O2 saturation:  97% on room air.  GENERAL:  In no acute distress.  BACK:  No tenderness to palpation.  She does well with forward  flexion/extension.  HAND:  Positive Finkelstein's on the left side  but no pain with carpal  metacarpal stress either side.  She does have some tenderness right  middle PIP.  NEURO:  Her mentation appears alert and clear.   Gait shows no toe dragging or knee instability.  She does not require an  assistive device.   IMPRESSION:  1. Lumbar facet-mediated pain.  Continued good response to lumbar      radiofrequency procedure.  She is about six months out.  As  discussed with the patient's daughter, usual duration is 10 to 12      months, but it can be repeated any time after six months.  2. Hypertension.  Followed through primary.  3. Hand osteoarthritis, specifically left carpal metacarpal improved      with thumb spica splint.  4. Left de Quervain's tenosynovitis.  Will try out some Voltaren gel      to that area.  5. I will see her back in about three months.  Should she have some      recurrence of back pain, she can be seen sooner than that and      decide whether we need to repeat the medial branch blocks or      radiofrequency neurotomy.      Erick Colace, M.D.  Electronically Signed     AEK/MedQ  D:  09/22/2007 13:41:12  T:  09/22/2007 14:36:24  Job #:  [id number]   cc:   Patrica Duel, M.D.  Fax: [phone]   Corrie Mckusick, M.D.  Fax: [phone]

## 2011-01-19 NOTE — Assessment & Plan Note (Signed)
The patient is a 77 year old female, who underwent L5 dorsal ramus  injection L4, L3, L2 medial branch blocks under fluoroscopic guidance  February 10, 2009.  She has had very good response to the injections.  She  went from a pre injection pain level 6/10 to a post injection.  Pain  level 3/10 and that has persisted until today.  Her pain only inhibits  her activity at a 3/10 level.  She can walk 10-50 minutes at a time.  She climbs steps.  She does not drive.  She needs some help with meal  prep, household duties, and shopping.  She has a aid that comes into her  house to help with this.   Her other review of systems positive for weakness, dizziness, confusion,  depression, anxiety.   Her pain medication includes Darvocet-N 100 which she really no longer  takes.  She has taken some tramadol, but she is really not currently  taking that either.  She takes over-the-counter acetaminophen for pain.   PHYSICAL EXAMINATION:  VITAL SIGNS:  Blood pressure 150/75, pulse 71,  respirations 18, O2 sat 96% on room air.  GENERAL:  No acute distress.  Mood and affect is appropriate.  BACK:  Has no tenderness to palpation.  EXTREMITIES:  She has 50% range, forward flexion and extension without  pain.  Lower extremity strength is normal.  Lower extremity deep tendon  reflexes are 1+ bilateral at the ankles and knees.   IMPRESSION:  Lumbar spondylosis without myelopathy improved with left L5  dorsal ramus injection L4, L3, L2 medial branch blocks.  No need for  reinjection at this time.  She is to call back when her pain returns,  difficult to determine exactly when this is going to be.  Her medial  branch blocks on the right side were done in May which is about a month  earlier than the other.  She has had radiofrequency neuropathy in April  2009.  Certainly, getting prolonged effect from medial branch blocks may  not need to proceed on to repeat RF.      Erick Colace, M.D.  Electronically  Signed     AEK/MedQ  D:  05/05/2009 16:36:53  T:  05/06/2009 05:23:44  Job #:  [id number]

## 2011-01-19 NOTE — Assessment & Plan Note (Signed)
An 77 year old female who I last saw on April 19, 2008.  She has a  history of lumbar spondylosis without myelopathy.  She has had some  sacroiliac distribution pain symptoms after a fall in which she broke  her left wrist.  She has been casted and has another followup  appointment with Dr. Romeo Apple next week for repeat x-rays.  She is about  2 months post fall and looking forward to getting out of her cast.   She has had no new pain problems other than having hurt her left knee  during the fall.  She still has some soreness in that area.  She has  pain on the lateral aspect of the knee.  She has had no swelling or  erythema.   She states that overall her pain has been reduced and is moderate at the  current time and she is also stating that she feels likely hydrocodone  has been too strong and she would like to be going back to her Darvocet  now that her pain is subsided to her baseline level.   She is basically independent with her bathing and dressing.  She can  walk about a quarter mile.  She can sit for about an hour and stand for  10 minutes.   Her Oswestry disability questionnaire is 55%.  Her pain is currently  4/10.  Pain is described as aching.   REVIEW OF SYMPTOMS:  Positive for trouble walking, dizziness,  depression, and anxiety.   PHYSICAL EXAMINATION:  VITAL SIGNS:  Blood pressure 127/55, pulse 67,  respiratory rate 18, and O2 sat 93% on room air.  GENERAL:  No acute distress.  Alert and oriented x3.  Affect flat.  Gait  normal.   IMPRESSION:  1. Lumbar spondylosis.  2. Left knee pain, exam is benign today, no evidence of fusion.  She      has mild lateral joint line tenderness.  Normal strength.  Normal      sensation.  Normal reflexes.   PLAN:  1. We will switch her back to Darvocet-N 100 t.i.d.  2. She will follow up with Dr. Romeo Apple next week.  3. No need for repeat lumbar radiofrequency neurotomy.  She has done      quite well and actually has gotten  over a flare-up that was related      to the fall.      Erick Colace, M.D.  Electronically Signed     AEK/MedQ  D:  05/17/2008 14:24:03  T:  05/18/2008 03:43:08  Job #:  [id number]   cc:   Vickki Hearing, M.D.  Fax: [phone]

## 2011-01-19 NOTE — Group Therapy Note (Signed)
NAME:  [name], NAPIER NO.:  [id number]   MEDICAL RECORD NO.:  [id number]          PATIENT TYPE:  INP   LOCATION:  A323                          FACILITY:  [facility]   PHYSICIAN:  Vickki Hearing, M.D.DATE OF BIRTH:  02/15/1918   DATE OF PROCEDURE:  DATE OF DISCHARGE:                                 PROGRESS NOTE   Brehanna Deveny has a proximal left humerus fracture and left wrist  fracture.  She is awaiting placement.  She is in stable condition and  complains of some constipation.  Neurovascular exam is intact.   Plan is for her to go to a skilled nursing facility, where fractures  will be treated nonsurgically, and when bed available, the patient can  be released.      Vickki Hearing, M.D.  Electronically Signed     SEH/MEDQ  D:  08/12/2008  T:  08/12/2008  Job:  [id number]

## 2011-01-19 NOTE — Assessment & Plan Note (Signed)
PROCEDURE:  This is a right knee intraarticular injection.   INDICATIONS:  Right knee pain following a fall with pain along the joint  line.   Informed consent was obtained after describing risks and benefits of the  procedure with the patient.  These include bleeding, bruising, and  infection.  She elected to proceed and has given written consent.  The  patient was put in a reclined position, supine.  Medial approach  utilized.  The area marked and prepped with Betadine, entered with 25-  gauge 1-1/2 needle.  Solution containing 1 mL of 40 mL Depo-Medrol and 4  mL of 1% lidocaine injected.  The patient tolerated the procedure well  with pain relief postinjection.      Erick Colace, M.D.  Electronically Signed     AEK/MedQ  D:  07/12/2008 16:56:08  T:  07/13/2008 05:46:46  Job #:  [id number]   cc:   Vickki Hearing, M.D.  Fax: [phone]

## 2011-01-19 NOTE — Procedures (Signed)
[name], [name]            ACCOUNT NO.:  [id number]   MEDICAL RECORD NO.:  [id number]          PATIENT TYPE:  REC   LOCATION:  TPC                          FACILITY:  [facility]   PHYSICIAN:  Erick Colace, M.D.DATE OF BIRTH:  [birth year]   DATE OF PROCEDURE:  DATE OF DISCHARGE:                               OPERATIVE REPORT   PROCEDURE:  Left L5 dorsal ramus injection and radiofrequency neurotomy,  left L4 medial branch block and radiofrequency neurotomy, left L3 medial  branch block and radiofrequency neurotomy under lumbar and sacral  fluoroscopic guidance.   INDICATIONS:  Lumbar spondylosis without myelopathy with facet syndrome.  She had, had a good response duration of approximately 7 months after  radiofrequency performed in August.   The pain interferes with activities, such as ambulation and other self-  care, such as household duties and shopping, and is only partially  responsive to medication management.   Informed consent was obtained after describing risks and benefits of the  procedure to the patient.  These include bleeding, bruising, infection,  loss of bowel or bladder function and temporary or permanent paralysis.  She elects to proceed and has given written consent.  The patient placed  prone on fluoroscopy table.  Betadine prep, sterile drape.  A 25-gauge  inch and half needle was used to anesthetize the skin and subcutaneous  tissue with 1% lidocaine x2 cc.  Then a 20-gauge 10-cm RF needle with a  10 mm curved __________ tip was inserted under fluoroscopic guidance,  first starting left S1 SAP sacral junction.  Bone contact made,  confirmed with lateral imaging.  Motor stem at 2 Hz demonstrated proper  needle location followed by injection of 1 mL of solution containing 1  mL of 4 mg/cc dexamethasone and 2 mL of 1% MPF lidocaine followed by  radiofrequency lesioning 70 degrees Celsius for 70 seconds.  Then the  left L5 SAP transverse process junction  targeted.  Bone contact made,  confirmed with lateral imaging.  Motor stem at 2 Hz demonstrated proper  needle location, followed by injection of 1 mL of the dexamethasone and  lidocaine solution and radiofrequency lesioning 70 degrees for 70  seconds.  Then the C-arm was moved cephalad to image the lumbar spine  region.  The left L4 SAP transverse process junction was targeted.  Bone  contact made, confirmed with lateral imaging.  Motor stem at 2 Hz  confirmed proper needle location, followed by injection of 1 mL of the  dexamethasone and lidocaine solution and radiofrequency lesioning 70  degrees Celsius for 70 seconds.  The patient tolerated the procedure  well.  Post injection instructions given.      Erick Colace, M.D.  Electronically Signed     AEK/MEDQ  D:  12/21/2007 17:24:40  T:  12/21/2007 18:31:37  Job:  [id number]

## 2011-01-19 NOTE — Assessment & Plan Note (Signed)
[name] follows up after radiofrequency procedure December 21, 2007,  L5 dorsal ramus, L4 medial branch, L3 medial branch under fluoroscopic  guidance on the left side.  She had initially a couple of weeks of  soreness in that area, but she is feeing better now and overall feels  like she has had some increased mobility.  She has gone out and sang  [address].  Her pain right now is 4/10 compared with 7/10 on April 16  prior to the procedure.  She can walk 25 minutes at a time now, which is  greater than the 10 minutes that she could walk prior to the procedure.  She does not drive anymore.  She needs some assistance with household  duties.   Blood pressure is 175/85, pulse 66, respiratory rate 18, O2 saturation  97% on room air.  GENERAL:  No acute distress.  Mood and affect appropriate.  BACK:  Shows some tenderness left PSIS, otherwise no tenderness.  No  evidence of swelling or erythema.  She has 5/5 strength bilateral lower  extremities.  She had decreased knee range of motion to about 95 degrees  bilaterally in flexion.  Lower extremity internal external rotation of  the hip is three-quarter range.   IMPRESSION:  Lumbar spondylosis without myelopathy improved functionally  and from a pain standpoint after radiofrequency neurotomy.  The previous  duration was seven months.  We will monitor.  We will continue Darvocet-  N 100 one p.o. t.i.d.  For now she should have enough medication to get  her through again.  I will see her back in three months.  Pharmacy will  fax this refill request.      Erick Colace, M.D.  Electronically Signed     AEK/MedQ  D:  01/26/2008 14:08:01  T:  01/26/2008 15:28:19  Job #:  [id number]

## 2011-01-19 NOTE — Procedures (Signed)
[name], [name]            ACCOUNT NO.:  [id number]   MEDICAL RECORD NO.:  [id number]          PATIENT TYPE:  REC   LOCATION:  [facility]                          FACILITY:  [facility]   PHYSICIAN:  Erick Colace, M.D.DATE OF BIRTH:  04-Apr-1918   DATE OF PROCEDURE:  03/27/2007  DATE OF DISCHARGE:                               OPERATIVE REPORT   PROCEDURE:  Bilateral L5 dorsal ramus injections, bilateral L4 medial  branch block, bilateral L3 medial branch block under fluoroscopic  guidance.   INDICATIONS:  Lumbar facet mediated pain only partially responsive to  narcotic analgesic medications.   Informed consent was obtained after describing risks and benefits to the  patient.  These include bleeding, bruising, infection, loss of bowel or  bladder function, temporary or permanent paralysis.  She elects proceed  and has given written consent.   DESCRIPTION OF PROCEDURE:  The patient placed prone on fluoroscopy  table.  Betadine prep, sterile drape.  A 5-gauge inch and half needle  was used to incise skin and subcu tissue 1% lidocaine x2 mL and 22  gauge, 3-1/2 inch spinal needle was inserted under fluoroscopic  guidance. First starting at the left S1 SAP sacral ala junction bone  contact made confirmed with lateral imaging.  Omnipaque 180 x 0.5 mL  demonstrated no intravascular uptake and 0.5 mL of dexamethasone  lidocaine solution injected.  The solution consists of 1 mL of 4 mg/mL  dexamethasone and 2 mL of 2% lidocaine. Then the left L4 SAP transverse  process junction targeted bone contact made confirmed with lateral  imaging.  Omnipaque 180 x 0.5 mL demonstrated no intravascular uptake  and 0.5 mL of Depo-Medrol lidocaine solution was injected.  Then the  left L4 SAP transverse process junction targeted bone contact made  confirmed with lateral imaging.  Omnipaque 180 x 0.5 mL demonstrated no  intravascular uptake and 0.5 mL of Depo-Medrol lidocaine solution  injected.   The patient tolerated procedure well.  Post injection vitals stable.  Post injection instructions given.  Pre injection pain level was 5/10  and post injection 3/10.  We will track pain further, schedule for  medial branch block in 1 month.      Erick Colace, M.D.  Electronically Signed     AEK/MEDQ  D:  03/27/2007 14:34:32  T:  03/27/2007 22:53:04  Job:  [id number]

## 2011-01-19 NOTE — Assessment & Plan Note (Signed)
[name] is an 77 year old female, returns after I last saw her on  06/02/07.  At that time we did an injection first carpal metacarpal.  She is doing better in terms of her pain so cannot crochet or knit.  Uses a wrist splint at night but nothing like a thumb SPICA. Her back  pain is still improved.  She has been able to cut back on her Darvocet  and in fact is taking less than 4 a day, more like 3 a day, occasionally  4.  She is 3 months status post radiofrequency neurotomy on the left  side.  Mood and affect are appropriate.   PHYSICAL EXAMINATION:  Gait shows no evidence of toe drag or knee  instability.  She has mildly widened base of support.  She has mild pain  with carpal metacarpal stress test on the left side.  She has good grip  strength on the right and fair on the left.   No evidence of intrinsic atrophy in bilateral lower extremities.  Lower  extremity strength is normal.  Hip range of motion is good.   IMPRESSION:  1. Lumbar facet  arthropathy improved after radiofrequency neurotomy.  2. Carpal metacarpal arthritis with hand pain.   PLAN:  1. Will refer to OT for thumb spica and antepartum.  2. Continue on Darvocet.  3. Next time I will see her is in 2 months and between now and then we      may be able to reduce her Darvocet to 100 tablets per month rather      than 120.      Erick Colace, M.D.  Electronically Signed     AEK/MedQ  D:  07/28/2007 14:12:56  T:  07/29/2007 16:10:96  Job #:  [id number]

## 2011-01-22 NOTE — Procedures (Signed)
NAME:  EBBA, GOLL NO.:  [id number]   MEDICAL RECORD NO.:  [id number]                   PATIENT TYPE:  REC   LOCATION:  [facility]                                  FACILITY:  [facility]   PHYSICIAN:  Erick Colace, M.D.           DATE OF BIRTH:  [birth year]   DATE OF PROCEDURE:  03/06/2004  DATE OF DISCHARGE:                                 OPERATIVE REPORT   MEDICAL RECORD NUMBER:  [id number]   DATE OF BIRTH:  1918-07-30   REASON FOR CONSULTATION:  The patient returns today after I last saw her on  Jan 20, 2004.  She states that several days after she saw me last she had  started having increasing levels of low back pain.  Her last right S1  transforaminal epidural steroid injection was on October 21, 2003.   PROCEDURE:  Right S1 transforaminal epidural steroid injection.   DESCRIPTION OF PROCEDURE:  Informed consent was obtained after describing  the risks and benefits of the procedure with the patient.  She wishes to  proceed.  She denies any aspirin times two weeks.  She has a driver with  her.   The patient was placed prone on the fluoroscopy table, Betadine prep and  sterile drape.  An inch and a half 25 gauge needle was used to anesthetize  the skin and subcutaneous tissues with 3 mL of 1% lidocaine.  Then under  fluoroscopic guidance a 22 gauge, three and a half inch spinal needle was  inserted through the skin and under fluoroscopic guidance manipulated into  the right S1 foramen.  Placement was confirmed by AP and lateral views as  well as contrast enhanced views.  Injection of Omnipaque 180 showed epidural  spread with 1 mL injected.  A solution containing 1 mL of 40 mg per mL of  Kenalog and 2 mL of 1% methylparaben free lidocaine were injection.  The  patient had no complications post injection. Her vital signs were stable and  her pain level reduced from a pre-injection pain level 8 out of 10 to a post-  injection pain level of 2 out  of 10.  She is to return in three weeks.  If  she has recurrence of pain, would then inject the left side at S1 as well.                                                Erick Colace, M.D.    AEK/MEDQ  D:  03/06/2004 11:59:12  T:  03/06/2004 17:03:04  Job:  [id number]   cc:   Cristi Loron, M.D.  [address]  [place] [address]  Fax: [phone]   Patrica Duel, M.D.  [address], [address]  [place]  [place] [address]  Fax:  [phone]   Nicki Guadalajara, M.D.  [phone] [address], [address]  [place], [place] [address]  Fax: [phone]

## 2011-01-22 NOTE — Discharge Summary (Signed)
NAME:  [name], [name]                      ACCOUNT NO.:  [id number]   MEDICAL RECORD NO.:  [id number]                   PATIENT TYPE:  INP   LOCATION:  A340                                 FACILITY:  [facility]   PHYSICIAN:  Corrie Mckusick, M.D.               DATE OF BIRTH:  [birth year]   DATE OF ADMISSION:  05/14/2004  DATE OF DISCHARGE:  05/18/2004                                 DISCHARGE SUMMARY   DISCHARGE DIAGNOSES:  Eighth rib fracture status post fall with resolved  pneumothorax.   HISTORY OF PRESENT ILLNESS:  For past medical history, please see admission  H&P.   HOSPITAL COURSE:  An 77 year old female with history of hypertension and  hyperlipidemia who fell and had an 8th rib fracture with a tiny pneumothorax  which resolved over night. No shortness of breath or other symptomatology  other than the left rib pain. She was admitted for pain control and  monitoring.   The day after admission, she continued to have no shortness of breath.  Repeat chest x-ray showed no pneumothorax. H and H were stable. She  continued to have pain at the left rib, and surgery was consulted to ensure  that nothing else would be needed. Dr. Lovell Sheehan did not feel like there was  any need to intervene.   On September 10, the patient still had left rib pain and was started on MS  Contin 15 mg b.i.d. with great control. She needed less pain medicines from  breakthrough. Continued to improved on a daily basis.   On the day of discharge, Dr. Nobie Putnam saw the patient who gave her a Fleet's  enema for constipation, but otherwise she was ready for discharge. She was  discharged home on MS Contin 15 mg b.i.d. as well as continuing on home  medications. She is also discharged on Peri-Colace for her bowels. She was  discharged with physical therapy as well as a walker. She is follow up in  the office in one week or sooner if need be. For discharge physical, please  see Dr. Geanie Logan note on date of  discharge.   DISCHARGE CONDITION:  Improved and stable.     ___________________________________________                                         Corrie Mckusick, M.D.   JCG/MEDQ  D:  05/18/2004  T:  05/18/2004  Job:  [id number]

## 2011-01-22 NOTE — Procedures (Signed)
[name], Bianca NO.:  [id number]   MEDICAL RECORD NO.:  [id number]          PATIENT TYPE:  REC   LOCATION:  [facility]                          FACILITY:  [facility]   PHYSICIAN:  Erick Colace, M.D.DATE OF BIRTH:  [birth year]   DATE OF PROCEDURE:  08/04/2006  DATE OF DISCHARGE:                               OPERATIVE REPORT   PROCEDURE:  Left S1 transforaminal lumbar epidural steroid injection  under fluoroscopic guidance, contrast-enhanced.   INDICATIONS:  Low back left lower extremity and left buttock pain only  partially relieved by narcotic analgesics.  Also, limited in terms of  the dosage escalation secondary to potential cognitive effects.   She has had previous good effects from left S1 transforaminal epidural  steroid injection dated April 28, 2006.   Informed consent was obtained after describing the risks and benefits of  the procedure to the patient.  These include bleeding, bruising,  infection, loss of bladder function, temporary paralysis and she would  like to proceed and has given written consent.  The patient placed prone  on fluoroscopy table.  Betadine prep with sterile drape.  A 25 gauge,  1.5-inch needle was used to incise skin and subcu tissues with 1%  lidocaine x2 mL.  A 22 gauge, 3.5-inch spinal needle was inserted in the  left S1 foramen under fluoroscopic guidance.  Omnipaque 180 x 1 mL  demonstrated no intravascular uptake in a good epidural flow.  Injection  contained 1 mL of 40 mg/mL Depo-Medrol and 2 mL of 1% lidocaine were  injected.  The patient tolerated procedure well.  She will see me in 1  month for followup.      Erick Colace, M.D.  Electronically Signed     AEK/MEDQ  D:  08/04/2006 13:30:23  T:  08/04/2006 19:41:20  Job:  [id number]

## 2011-01-22 NOTE — Procedures (Signed)
[name], [name]            ACCOUNT NO.:  [id number]   MEDICAL RECORD NO.:  [id number]          PATIENT TYPE:  REC   LOCATION:  [facility]                          FACILITY:  [facility]   PHYSICIAN:  Erick Colace, M.D.DATE OF BIRTH:  [birth year]   DATE OF PROCEDURE:  04/28/2006  DATE OF DISCHARGE:                                 OPERATIVE REPORT   PROCEDURE:  Left S1 transluminal epidural steroid injection under  fluoroscopic guidance.   INDICATIONS:  Left S1 radiculopathy with lumbar spinal stenosis.  The pain  is only partially responsive to narcotic analgesics.  She cannot tolerate  higher dosages.   Informed consent was obtained after describing risks and benefits of the  procedure; these include, bleeding, bruising, infection, loss of bowel and  bladder function, temporary or permanent paralysis.  She would like to  proceed.  She has given a written consent.   A 25-gauge 1-1/2 inch needle was used to anesthetize the skin and  subcutaneous tissue and 1% lidocaine x 2 mL.  A 22-gauge 3-1/2-inch spinal  needle was inserted into the left S1 foramen under fluoroscopic guidance.  Omnipaque 180 x 1cc mL demonstrated no intravascular uptake.  Then a  solution containing 1 mL of 40 mg/mL Depo-Medrol plus 2 mL of 1% lidocaine  were injected.  The patient tolerated the procedure well.  Preinjection pain  level 5/10, postinjection pain level 1-2/10.  She will see me in 3 months in  followup visit.      Erick Colace, M.D.  Electronically Signed     AEK/MEDQ  D:  04/28/2006 17:13:19  T:  04/29/2006 09:37:30  Job:  [id number]

## 2011-01-22 NOTE — Assessment & Plan Note (Signed)
INTERVAL HISTORY:  Date of last visit May 31, 2005 did a bilateral S1  transforaminal epidural steroid injection under fluoroscopic guidance.  She  did quite well after the injection and in fact she started mopping her back  porch but this caused an exacerbation of her pain.  In addition she had a  fall resulting in facial contusion but no fractures.  She states because of  that she on some days has taken four Darvocet a day although on other days  she has taken two only.   Her pain is worse in the morning; worse with walking, bending, and standing;  improves with medication and injections.  She can climb a few steps.  She  can walk 15 minutes at a time but no longer drives.  She needs some  assistance with shopping as well as some household duties.   REVIEW OF SYSTEMS:  Positive for weakness in the legs, dizziness, confusion,  anxiety.  No further falls noted.   PHYSICAL EXAMINATION:  Blood pressure 176/84, pulse 83, respiratory rate 16,  O2 saturation 97% room air.   Her lower extremity exam has bilateral ankle contractures and ankle  dorsiflexion weakness graded at 3-/5.  She has good knee extensor and hip  flexor strength.  She has 1 deep tendon reflexes at the ankles and 2 at the  knees.  Her gait shows no evidence of toe drag or knee instability.   IMPRESSION:  Lumbar stenosis with intermittent radicular symptoms.   PLAN:  1.  I will repeat S1 transforaminal epidural steroid injections in 1 month.  2.  Continue Darvocet 100 one p.o. t.i.d. - does not appear to be the cause      of her fall and she has not had recurrent falls.  3.  In terms of bilateral ankle contractures, we will have physical therapy      come in and work with her on both ankle stretching as well as      dorsiflexor strengthening.      [name], M.D.  Electronically Signed     AEK/MedQ  D:  08/24/2005 10:34:53  T:  08/25/2005 19:11:11  Job #:  [id number]   cc:   Patrica Duel, M.D.  Fax: [phone]

## 2011-01-22 NOTE — Assessment & Plan Note (Signed)
REFERRING PHYSICIAN:  Cristi Loron, M.D.   Ms. Lavalle returns today after having right S1   Dictation ends here.      Erick Colace, M.D.   AEK/MedQ  D:  11/22/2003 13:13:37  T:  11/22/2003 14:22:23  Job #:  [id number]   cc:   Cristi Loron, M.D.  [address]  [place]  [place] [address]  Fax: [phone]

## 2011-01-22 NOTE — Assessment & Plan Note (Signed)
Ms. [name] returns today after she last saw me February 08, 2005.  She has  been continuing on Darvocet-N 100 once or twice a day.  She has had no  falls.  She thinks that her back pain and leg pain are about stable,  although she thinks perhaps her last injection done on Jan 14, 2005, is  starting to wear off.  Her pain score currently is 7/10, but her pain  interference scores remain fairly stable at general activity 5,  relationships with other people 3, and enjoyment of life 4.  Her sleep is  poor.  She has been started on some type of sleeping medicine by her primary  care physician.   She has no suicidal ideations.  She has positive depression and anxiety,  constipation.   INTERVAL MEDICAL HISTORY:  Lower BP medication dosage because of what sounds  like some orthostatic hypotension-type symptoms.  In particular, it sounds  like she reduced her Cardizem dose from 240 mg to 120 mg.   PHYSICAL EXAMINATION:  VITAL SIGNS:  Blood pressure 124/51, pulse 50,  respiratory rate 16, O2 saturation is 95% on room air.  GENERAL:  No acute distress.  Mood and affect appropriate.  She is able to  ambulate without a cane in the room with a slightly widened-based support.  No evidence of toe drag or knee instability.  She had no pain on palpation  of the lumbar spine.  She has good hip range of motion, knee, ankle and foot  range of motion bilaterally.  She has normal deep tendon reflexes bilateral  knees, but absent at bilateral ankles.   Motor strength is 4+ in the bilateral hip flexor, knee extensor, ankle  dorsiflexor.   IMPRESSION:  Lumbar stenosis with intermittent radicular symptoms.  She is  doing quite well in terms of radicular symptoms but feels perhaps they may  be starting to come back.   PLAN:  1.  Will continue Darvocet-N 100 one p.o. t.i.d.  2.  Will schedule for bilateral S1 transforaminal epidural steroid      injections in one month.  If she is doing real well by that time,  if she      is really not having much in terms of radicular complaints of that time,      we can hold off again, but based on prior history of injections, she is      at her usual duration.      Erick Colace, M.D.  Electronically Signed     AEK/MedQ  D:  05/06/2005 16:55:20  T:  05/07/2005 06:11:27  Job #:  [id number]   cc:   Patrica Duel, M.D.  Fax: [phone]   Cristi Loron, M.D.  Fax: [phone]

## 2011-01-22 NOTE — Procedures (Signed)
NAMEARDINE, IACOVELLI NO.:  [id number]   MEDICAL RECORD NO.:  [id number]          PATIENT TYPE:  REC   LOCATION:  [facility]                          FACILITY:  [facility]   PHYSICIAN:  Erick Colace, M.D.DATE OF BIRTH:  1917/10/27   DATE OF PROCEDURE:  09/08/2005  DATE OF DISCHARGE:                               OPERATIVE REPORT   PROCEDURE:  Left first carpal metacarpal injection.   INDICATIONS FOR PROCEDURE:  First CMC joint osteoarthritis with flare up  pain status post fall.  She has had negative wrist x-rays in terms of  fracture.  The pain is only partially relieved with Darvocet.  Informed  consent was obtained after describing the risks and benefits of the  procedure to the patient.  These include bleeding, bruising, infection.  She elects to proceed and has given written consent.   DESCRIPTION OF PROCEDURE:  The area was prepped with Betadine and  draped.  A 25 gauge 1.5 inch needle to raise a skin wheal with 0.5 mL of  1% lidocaine and a 27 gauge 1.5 inch needle was inserted into the joint  injecting 1.5 mL of 40 mg per mL of Depo-Medrol and 0.5 mL of 1%  lidocaine.  The patient tolerated the procedure well.  She will wear a  splint for one week and then remove it.  If pain, she can stay out of  the brace.  Post injection instructions given.      Erick Colace, M.D.  Electronically Signed     AEK/MEDQ  D:  09/08/2006 17:13:12  T:  09/08/2006 19:59:49  Job:  [id number]

## 2011-01-22 NOTE — Assessment & Plan Note (Signed)
HISTORY OF PRESENT ILLNESS:  Ms. Herskowitz returns today.  She had bilateral  S1 transforaminal epidural steroid injections under fluoroscopic guidance on  Jan 14, 2005.  Since that time she has been walking further distances.  She  can walk 15 minutes straight now.  She has had no post injection  complications.  Her last recorded walking time was about five minutes, so  this has tripled her distance.  Her pain levels she states are about 6, but  pain interference scores are only 4 at this point, and preinjection was  about 8-10/10.   She continues on Darvocet one p.o. t.i.d.   No suicidal ideation.  She keeps her medications in a secure location.   PHYSICAL EXAMINATION:  VITAL SIGNS:  Blood pressure 176/62,  pulse 54,  respirations 16, O2 saturation 100% on room air.  GENERAL:  No acute distress.  Mood and affect appropriate.  Ambulates with a  cane.  BACK:  There is no tenderness to palpation.  EXTREMITIES:  She has good strength in bilateral lower extremities.  No  numbness in the feet.   IMPRESSION:  Lumbar stenosis with intermittent radicular symptoms.  Good  result with bilateral S1 trans foraminal epidural steroid injection under  fluoroscopic guidance done on Jan 14, 2005.   PLAN:  1.  Continue Darvocet-N 100 one p.o. t.i.d.  2.  I will see her back in about three months for recheck and possible      reinjection.  After that time if she feels results are wearing off and      if she feels like it is wearing off prior to that time, she can call      this office and schedule another injection.       AEK/MedQ  D:  02/08/2005 17:43:31  T:  02/08/2005 23:37:38  Job #:  [id number]   cc:   Patrica Duel, M.D.  [address], [address]  [place]  [place] [address]  Fax: [phone]

## 2011-01-22 NOTE — Assessment & Plan Note (Signed)
HISTORY OF PRESENT ILLNESS:  [name] returns today after I last saw  her on November 22, 2003.  She has a history of lumbar spinal stenosis with  chronic recurrent right S1 radiculopathy.  She has had L3-4 foraminotomy and  laminotomy.   She has had her last right S1 transforaminal epidural steroid injection on  October 22, 2003.  She has had 2/5 pain last visit, approximately 2 months  ago, and describes her pain as getting worse, but she indicates on her scale  a 4-5/10 pain.  She is taking 1-2 Darvocet N-100's per day.  She has not had  any falls.  She sometimes feels timed from her medicine, although, she  sometimes wonders whether it could be some of her other medication as well.   PAST MEDICAL HISTORY:  Relatively unremarkable other than coming off her  statin medication which actually was the case last time.   CURRENT MEDICATIONS:  1. Valium.  2. Tylenol.  3. Nexium.  4. Darvocet.  5. Vitamin E.  6. Glucosamine.  7. Cardizem.  8. Baby aspirin.  9. Premarin.  10.      Atenolol.  11.      Allopurinol.  12.      Diovan.   REVIEW OF SYSTEMS:  Positive for weakness, some dizziness, anxiety, poor  sleep and taste loss as well as nausea, reflux, constipation, poor appetite.   PHYSICAL EXAMINATION:  VITAL SIGNS:  Blood pressure 140/55, pulse 60, O2  saturation 98% on room air.  GENERAL APPEARANCE:  No acute distress, mood and affect appropriate.  BACK:  No tenderness to palpation.  NEUROLOGICAL:  She has 1/4 deep tendon reflexes bilateral knees and ankles,  2 at the biceps, triceps, brachioradialis.  Motor strength is 4/5 bilateral  lower extremities.  She has no pain palpated in bilateral lower extremities.  Gait is without evidence of toe drag or knee instability.  No __________  noted.  No forward stoop.   IMPRESSION:  1. Lumbar spinal stenosis with right lower extremity radicular symptoms.     Still better than preinjection.  However, gradually worsening.  2.  Nausea, chronic related to what appears some gastroesophageal reflux     disease.  Has been on Nexium, double dosage.   PLAN:  I think that she could benefit from another injection.  She would  like to hold off another month or two so she can maximize her timing just  prior to her vacation.   I will see her back in two months for a right S1, transforaminal epidural  steroid injection, but she is to call sooner if her pain worsens more  rapidly.   In terms of pain management, she can continue on her Darvocet 1-2 tablets  daily.  She does take some Tylenol, and I have cautioned her about maximum  dosage of acetaminophen.      Erick Colace, M.D.   AEK/MedQ  D:  01/20/2004 14:09:39  T:  01/20/2004 14:34:49  Job #:  [id number]   cc:   Cristi Loron, M.D.  [address]  [place] [address]  Fax: [phone]   Patrica Duel, M.D.  [address], [address]  [place]  [place] [address]  Fax: [phone]

## 2011-01-22 NOTE — Assessment & Plan Note (Signed)
MEDICAL RECORD NUMBER:  [id number]   DATE OF BIRTH:  [birth year]   DATE OF SERVICE:  November 19, 2004   INTERVAL HISTORY:  [age]-year-old female with lumbar stenosis who was  last seen by me September 17, 2004.  She had a fall since I last saw her; hit  the back of her head.  She is not using her walker consistently and did not  use it at the time but acknowledges that it does help her balance.  She has  had no headaches but she has had some neck stiffness since that time.  She  denies any numbness or weakness in the arms or legs, no increased falls, no  mental status changes and her daughter corroborates with this.  She has had  some increased back pain since that time.  Her last spinal injection was  bilateral S1 transforaminal on July 20, 2004.   Pain levels 8/10.  Pain inhibition scores:  General activity 7, relationship  with others 3, enjoyment of life 5, sleep is fair, her relief with meds is  7/10 in good range.  Her pain is mainly right side of the neck, low back,  back of the knees and back of the ankles.  She walks for about 5 minutes at  a time without assistance.  She can climb steps but does not drive.  She is  retired.   Her review of systems positive for confusion, depression, anxiety,  dizziness.  Her daughter does help her with some house chores.  She has some  constipation and some nausea.   She has no change in her past medical history other than above.  Social  history is above; widow x4 years.  She states she keeps her medications in  secure location.   PHYSICAL EXAMINATION:  Blood pressure 155/68, pulse 54, respiratory rate 16,  O2 saturation 97% room air.   General, no acute distress.  Mood and affect appropriate.  Extremities no  clubbing, cyanosis, or edema.  Her neck has decreased lateral bending as  well as extension about 50% range, forward flexion to about 75% range.  She  has tightness bilateral sternocleidomastoid as well as cervical  paraspinal's.  She has strength bilateral upper and lower extremities.  She  has normal deep tendon reflexes bilateral upper and lower extremities;  normal sensation.  Range of motion bilateral upper and lower extremities  good other than some reduction in hip range of motion bilaterally.   Ambulation mild tendency towards valgus at the knees otherwise mild  crouching; no evidence of toe drag or knee instability.  Her transfers are  independent.   IMPRESSION:  1.  History of lumbar stenosis/intermittent radicular symptoms flared up      status post fall.  2.  Fall.  She has a balance deficit and poor judgment in terms of use of      assistive device.  Reinforced need for walker.   PLAN:  1.  Continue Darvocet, increase to t.i.d., may need to try a different agent      empirically such as long-acting      tramadol.  2.  Schedule for bilateral S1 transforaminal epidural steroid injections      under fluoroscopic guidance.      AEK/MedQ  D:  11/19/2004 16:49:57  T:  11/19/2004 19:14:42  Job #:  [id number]   cc:   Cristi Loron, M.D.  [address]  [place]  [place] [address]  Fax: [phone]   Loraine Leriche  Nobie Putnam, M.D.  [address], [address]  [place]  [place] [address]  Fax: [phone]

## 2011-01-22 NOTE — Assessment & Plan Note (Signed)
Thursday, September 08, 2006:   The patient follows up today. She had a left S1 transforaminal and  epidural steroid injection under fluoroscopic guidance on August 02, 2006. She has had good relief of her lower extremity pain and in fact  had one of the best holidays she has had in recent years. She has only  had exacerbation of pain with prolonged standing such as cooking, per  her daughter, for Christmas and New Year's dinner. She continues to  complain of left wrist pain and left wrist x-rays demonstrated first  carpal metacarpal joint arthritis, otherwise no other abnormalities.   MEDICATIONS:  Darvocet N 100 p.o. t.i.d. She states that on one occasion  she took an extra tablet.   Her pain is rated as a 4/10 at rest and 7/10 with activity. She can walk  100 steps.   REVIEW OF SYSTEMS:  Positive for depression, anxiety, but no suicidal  thoughts. She does have some dizziness.   PHYSICAL EXAMINATION:  Blood pressure is 162/72, pulse 59, respirations  16, O2 sat 96% on room air. Ambulates with a cane. No evidence of toe  drag or gait instability. Her lower extremity strength is good. Her left  wrist demonstrates pain with Finkelstein's maneuver, but more so with  hyperextension of the [facility] joint.   IMPRESSION:  1. CMC joint arthritis, now with exacerbation status post fall a      couple of months ago. Her pain is only partially responsive to      Darvocet. Will inject today.  2. Lumbar stenosis. Good control with combination of epidural steroid      injection plus Darvocet. Will do the injection today and see her      back in 2 months.      Erick Colace, M.D.  Electronically Signed     AEK/MedQ  D:  09/08/2006 17:15:54  T:  09/08/2006 19:53:17  Job #:  [id number]

## 2011-01-22 NOTE — Procedures (Signed)
[name], MONRREAL NO.:  [id number]   MEDICAL RECORD NO.:  [id number]           PATIENT TYPE:   LOCATION:                                 FACILITY:   PHYSICIAN:  Erick Colace, M.D.DATE OF BIRTH:  1918/03/28   DATE OF PROCEDURE:  12/01/2006  DATE OF DISCHARGE:                               OPERATIVE REPORT   PROCEDURE:  Bilateral S1 transforaminal epidural steroid injection under  fluoroscopic guidance.   INDICATION:  Lumbar stenosis with radicular pain now having more back  pain; has had a good and prolonged effect from epidural injections in  the past, the pain is only partially responsive to narcotic analgesic  medications.   DESCRIPTION OF PROCEDURE:  Informed consent was obtained after  describing risks and benefits of the procedure to the patient.  These  include bleeding, bruising, infection, loss of bowel and bladder  function, temporary or permanent paralysis.  She elects to proceed; and  has given written consent.  The patient placed prone on fluoroscopy  table with Betadine prep and sterile drape.  A 25-gauge, 1-1/2-inch  needle was used to incise the skin and subcu tissue, and 1% lidocaine 10  mL.  A 22-gauge 3-1/2-inch spinal needle was first inserted into the  left S1 foramen under AP and lateral imaging.  Omnipaque 180 x 0.5 mL  demonstrated no intravascular uptake with a good foraminal spread, and  epidural spread followed by injection of 1 mL of 40 mg per mL Depo-  Medrol plus 1 mL 1% lidocaine MPF.   This same procedure was repeated on the right side; using the same  equipment, and same injection solution.  The patient tolerated procedure  well.  Pre-and-post injection vitals were stable.  Postinjection  instructions given.  Preinjection pain level  8/10, postinjection pain  level 6/10. We will see her back in 3 weeks.  If she is experiencing  continued back pain, we may need to do lumbar medial branch blocks.      Erick Colace, M.D.  Electronically Signed     AEK/MEDQ  D:  12/01/2006 15:05:49  T:  12/01/2006 16:13:54  Job:  [id number]

## 2011-01-22 NOTE — Assessment & Plan Note (Signed)
Ms. [name] returns today after I did right S1 transforaminal steroid  injection on October 21, 2003.  I did reevaluate her on November 22, 2003,  with improvement in her pain control.  She feels like the injection is  wearing off slightly, although certainly back to her preinjection level of  pain.  She is still taking only one to two Darvocet per day.  Functional  level is good.  Independent with her ADLs and mobility.   Interval medical history is unremarkable.   The pain diagram shows some pain in the right calf muscle, as well as across  the low back.  The pain level average 4/10.   PAIN IMPROVEMENT FACTORS:  Rest, heat, therapy, and medication.  Made worse  with walking, bending, working, and standing.   SOCIAL HISTORY:  Lives alone.  Widowed.  Her daughter brings her to  appointments.  She is retired.   REVIEW OF SYSTEMS:  Positive for weakness, dizziness, anxiety, poor sleep,   Dictation canceled.      Erick Colace, M.D.   AEK/MedQ  D:  01/20/2004 17:07:59  T:  01/20/2004 60:45:40  Job #:  [id number]   cc:   Cristi Loron, M.D.  [address]  [place] [address]  Fax: [phone]   Patrica Duel, M.D.  [address]  [place]  [place] [address]  Fax: [phone]   Nicki Guadalajara, M.D.  [address]  [place], [place] [address]  Fax: [phone]

## 2011-01-22 NOTE — Assessment & Plan Note (Signed)
This is a 77 year old female with lumbar stenosis who was admitted with  radicular symptoms.  She was last seen by me July 20, 2004 for bilateral  S1 transforaminal epidural sterile injection under fluoroscopic guidance.  She has had good relief with this injection and continues to do quite well.   INTERVAL HISTORY:  Positive for some falls which resulted in hospitalization  due to rib fracture and pneumothorax.  She does not note any loss of  consciousness with that hospitalization.  Since that time she has undergone  home health physical therapy and they have upgraded her to the point where  she does not use a cane or a walker.  She is accompanied by her daughter  today.   Pain is about 5/10 on average, ranging 4-7.   SOCIAL/FAMILY/VOCATIONAL HISTORY:  Unchanged.   REVIEW OF SYSTEMS:  As above.  No suicidal thoughts.  Continues to have some  episodes where she feels lightheaded.   MEDICATIONS:  1.  Darvocet-N 100 one p.o. b.i.d. on average.  2.  Valium q.h.s. p.r.n.  3.  Tylenol PM.  4.  Tylenol Arthritis.  5.  Nexium.  6.  Vitamin E.  7.  Glucosamine.  8.  Cardizem.  9.  Baby aspirin  10. Premarin.  11. Atenolol.  12. Allopurinol.  13. Diovan.  14. Altace.   PHYSICAL EXAMINATION:  VITAL SIGNS:  Blood pressure 153/49; pulse of 52;  respirations 20; O2 sat 98% on room air.  BACK:  No CVA tenderness to palpation.  She has full spine range of motion  and forward flexion/extension is about 50% range.  She ambulates without any  toe drag or knee instability.  HEENT:  Sclerae are anicteric.   IMPRESSION:  Lumbar stenosis with radicular pain - Generally improved.   PLAN:  We will see her back in about 2 months.  We will check some LFTs at  that time.   We will see how long the last injection lasts for her.  We might need to  reinject her in the next 1-2 months.      AEK/MedQ  D:  09/17/2004 16:53:30  T:  09/17/2004 17:33:18  Job #:  [id number]   cc:   Cristi Loron, M.D.  [address]  [place] [address]  Fax: [phone]   Patrica Duel, M.D.  [address]  [place]  [place] [address]  Fax: [phone]

## 2011-01-22 NOTE — Procedures (Signed)
NAME:  [name], [name] NO.:  [id number]   MEDICAL RECORD NO.:  [id number]          PATIENT TYPE:  REC   LOCATION:  [facility]                          FACILITY:  [facility]   PHYSICIAN:  Erick Colace, M.D.DATE OF BIRTH:  1918-03-21   DATE OF PROCEDURE:  07/20/2004  DATE OF DISCHARGE:                                 OPERATIVE REPORT   MEDICAL RECORD NUMBER:  [id number].   DATE OF BIRTH:  1917/11/02.   INDICATIONS:  A 77 year old female with lumbar stenosis and intermittent  radicular symptoms.  Flared up status post fall.  I last saw her on July 07, 2004.  Last epidural sternoid injection was April 09, 2004.   Informed consent was obtained after describing the risks and benefits of the  procedure with the patient.   The patient was placed supine on the fluoroscopy table.  Betadine prep and  sterile drape.  A 25 gauge 1-1/4 inch needle was used to anesthetize the  skin and subcutaneous tissues with 1.5 mL lidocaine first on the left side.  Then a 22 gauge 3-1/2 inch spinal styleted needle was inserted under  fluoroscopic guidance in the left S1 foramen.  Omnipaque 180 x 0.5 mL showed  no intravascular uptake.  Then a solution containing 1.5 mL of 40 mg/mL  Kenalog plus 3.5 mL of 1% lidocaine was injected.  There were 2.5 mL of this  solution injected.  Then the right side was marked and then a 25 1-1/4 inch  needle was used to anesthetize the skin and subcutaneous tissues with 1.5 mL  of 1% lidocaine.  Then a 22 gauge 3-1/2 inch spinal needle was inserted to  the right S1 foramen under both AP and lateral imaging.  Then 2.5 mL of the  above solution were injected after negative drawback for blood.  The patient  tolerated the procedure well.  Post injection instructions given.  The  patient will return in two months.       AEK/MEDQ  D:  07/20/2004 12:04:40  T:  07/20/2004 12:37:36  Job:  [id number]

## 2011-01-22 NOTE — Procedures (Signed)
NAMEKENDREA, CERRITOS NO.:  [id number]   MEDICAL RECORD NO.:  [id number]          PATIENT TYPE:  REC   LOCATION:  [facility]                          FACILITY:  [facility]   PHYSICIAN:  Erick Colace, M.D.DATE OF BIRTH:  12/22/1917   DATE OF PROCEDURE:  09/23/2005  DATE OF DISCHARGE:                                 OPERATIVE REPORT   MEDICAL RECORD NUMBER:  [id number].   The patient tolerated her left S1 transforaminal epidural steroid injection  well. Postinjection, did have elevated blood pressures around 190 to 200  systolic over 60 diastolic. She felt a bit woozy but otherwise no other  complaints. She had similar effect after prior injections. States that she  had not eaten all day. She did take her blood pressure medications yesterday  and not due until this afternoon. Her daughter is with her today. She states  that if she can get her home she can recheck her blood pressure and then  notify primary care physician. She had no other symptomatology other than  listed above. It is possible that this is steroid effect but more likely due  to her underlying hypertension history and anxiety related to injection.      Erick Colace, M.D.  Electronically Signed     AEK/MEDQ  D:  09/23/2005 17:17:49  T:  09/24/2005 09:18:44  Job:  [id number]

## 2011-01-22 NOTE — Procedures (Signed)
NAMEMCKINSEY, KEAGLE NO.:  [id number]   MEDICAL RECORD NO.:  [id number]          PATIENT TYPE:  REC   LOCATION:  [facility]                          FACILITY:  [facility]   PHYSICIAN:  Erick Colace, M.D.DATE OF BIRTH:  Jul 14, 1918   DATE OF PROCEDURE:  05/31/2005  DATE OF DISCHARGE:                                 OPERATIVE REPORT   MEDICAL RECORD NUMBER:  [id number].   PROCEDURE:  Bilateral S1 transforaminal epidural steroid injection under  fluoroscopic guidance.   REASON FOR TREATMENT:  Continued spinal stenosis related to pain on Darvocet-  N 100 t.i.d. Good results from previous injections done Jan 14, 2005.   Informed consent was obtained after describing risks and benefits of the  procedure to the patient. These include bleeding, bruising, infection, loss  of bowel and bladder function, temporary or permanent paralysis, and she  elects to proceed.   The patient placed prone on fluoroscopy table, Betadine prep, sterile drape.  A 25-gauge 1-1/2-inch needle was used to infiltrate skin with 1 cc of 1%  lidocaine on each side. A 22-gauge 3-1/2-inch spinal needle was inserted  first in the left S1 neural foramen and then into the right S1 foramen under  fluoroscopic guidance. Omnipaque 180, live fluoro, initially shows an  intravascular uptake on the left side. Needle was adjusted, and appropriate  epidural flow was obtained, and then on the right side. There was no  evidence of intravascular uptake. One cc of 40 mg/cc Depo-Medrol and 2 cc of  1% lidocaine were injected into each side. The patient tolerated the  procedure well. Refilled Darvocet-N 100 #90.      Erick Colace, M.D.  Electronically Signed     AEK/MEDQ  D:  05/31/2005 12:26:12  T:  06/01/2005 10:29:45  Job:  [id number]   cc:   Patrica Duel, M.D.  Fax: [phone]

## 2011-01-22 NOTE — Op Note (Signed)
NAMEADDILYNE, BACKS            ACCOUNT NO.:  [id number]   MEDICAL RECORD NO.:  [id number]          PATIENT TYPE:  AMB   LOCATION:  [facility]                          FACILITY:  [facility]   PHYSICIAN:  Rose Phi. Maple Hudson, M.D.   DATE OF BIRTH:  09-May-1918   DATE OF PROCEDURE:  DATE OF DISCHARGE:                                 OPERATIVE REPORT   PREOPERATIVE DIAGNOSIS:  Stage I carcinoma of the right breast.   POSTOPERATIVE DIAGNOSIS:  Stage I carcinoma of the right breast.   OPERATION:  Right partial mastectomy.   SURGEON:  Rose Phi. Maple Hudson, M.D.   ANESTHESIA:  General.   OPERATIVE PROCEDURE:  This 77 year old female had presented with a palpable  nodule in the upper outer quadrant of her right breast. A core biopsy of  this showed an invasive mammary cancer, that is ER-PR positive. After some  consideration, we scheduled her for just a right partial mastectomy.   After suitable general anesthesia was induced. The patient was placed in the  supine position with the arms extended on the arm board. A rather large  palpable mass in the upper outer quadrant which, I think, is related to the  hematoma from her biopsy was then outlined with the marking pencil.  A  curved incision was made and a wide excision of this large palpable mass was  carried out. It was oriented for the pathologist; and submitted for margin  evaluation. The evaluation of margins was negative.   I thoroughly infiltrated the incision with 0.25% Marcaine; and then closed  in two layers of 3-0 Vicryl and a subcuticular 4-0 Monocryl and Steri-  Strips. Dressings applied; and the patient transferred to the recovery room  in satisfactory condition; having tolerated procedure well.      Rose Phi. Maple Hudson, M.D.  Electronically Signed     PRY/MEDQ  D:  10/25/2005  T:  10/25/2005  Job:  [id number]

## 2011-01-22 NOTE — H&P (Signed)
NAME:  [name], [name]                      ACCOUNT NO.:  [id number]   MEDICAL RECORD NO.:  [id number]                   PATIENT TYPE:  INP   LOCATION:  A340                                 FACILITY:  [facility]   PHYSICIAN:  Corrie Mckusick, M.D.               DATE OF BIRTH:  1918/04/08   DATE OF ADMISSION:  05/14/2004  DATE OF DISCHARGE:                                HISTORY & PHYSICAL   ADMISSION DIAGNOSIS:  Status post fall with small pneumothorax due to eighth  left rib fracture.   HISTORY OF PRESENT ILLNESS:  An 77 year old female with hypertension,  hyperlipidemia, who slipped at home and fell.  She did not have any loss of  consciousness, did not hit her head in anyway.  She has no visual changes,  no other neurological symptoms positive.  She hit her left side and has a  sore area on her left mid-axillary line.  She came into the emergency  department and found to have a left eighth rib fracture with a scant  pneumothorax.  She has no respiratory difficulties whatsoever, but felt she  should be admitted for closer observation.   REVIEW OF SYSTEMS:  CARDIOVASCULAR:  Negative.  GASTROINTESTINAL:  Negative.  GENITOURINARY:  Negative.   PAST MEDICAL HISTORY:  1.  Hypertension.  2.  Hyperlipidemia.   PAST SURGICAL HISTORY:  1.  Total hysterectomy.  2.  Tonsillectomy.  3.  Appendectomy.  4.  Lumbar disk surgery in 1999.  5.  Hiatal hernia in 1980.   SOCIAL HISTORY:  Does not drink or smoke.  She is retired.   FAMILY HISTORY:  Noncontributory.   REVIEW OF SYSTEMS:  Otherwise negative as stated above.   PHYSICAL EXAMINATION:  VITAL SIGNS:  Temperature 98.4, blood pressure  138/84, heart rate 72, respirations 18.  GENERAL:  When I saw the patient she was pleasant, joking, talking, in no  acute distress.  HEENT:  Negative.  Oropharynx clear.  NECK:  Supple, no lymphadenopathy.  CHEST:  Clear to auscultation bilaterally.  CARDIOVASCULAR:  S1 and S2.  Regular rate and  rhythm.  No murmurs, rubs, or  gallops.  ABDOMEN:  Soft, nontender, nondistended.  EXTREMITIES:  Without cyanosis, clubbing, or edema.   LABORATORY DATA:  See laboratory reports for details.   ASSESSMENT AND PLAN:  An 77 year old female status post fall with left rib  fracture and scant pneumothorax.  Admit for close monitoring and pain  control.  Consult Dr. Lovell Sheehan in the morning to ensure that nothing needs to  be done to the pneumothorax.  Repeat chest x-ray in the morning, and we will  continue to observe for at least 24 hours.     ___________________________________________  Corrie Mckusick, M.D.   [place] Melter  D:  05/15/2004  T:  05/15/2004  Job:  [id number]

## 2011-01-22 NOTE — Assessment & Plan Note (Signed)
MEDICAL RECORD NUMBER:  [id number].   This is followup of low back pain and pain in legs.  The patient returns  after I last saw her on April 09, 2004.  I did a left S1 transforaminal  epidural sternoid injection at that time which produced improvement in the  left lower extremity pain.  A prior right S1 transforaminal on March 06, 2004,  helped her right lower extremity pain.  The pain is now in both lower  extremities, as well as across the back.  Interval history positive for a  fall which was not related to loss of consciousness, although she got dizzy.  She does not note any temporal relationship to the Darvocet.  This was on  May 14, 2004.  She broke a rib, had partial pneumothorax and was  hospitalized for about four days at [facility].  The pain is  currently 5/10, ranging from 3-7, improves with rest and medication and is  made worse with walking, bending, sitting and working.  She is back to doing  some walking around the house and is able to live alone.   REVIEW OF SYSTEMS:  Positive for some dizziness and confusion which is  exacerbated somewhat by the Darvocet.  She has had some depression, poor  sleep, some headaches, hearing loss, as well as weight loss over the last  year.  She has been recently started on Lexapro for depression.   PHYSICAL EXAMINATION:  Blood pressure 147/74, pulse 55, respirations 16.  Gait shows a limp.  Affect is alert.  Appearance is normal.   Her motor exam shows normal strength of bilateral hip flexion, knee  extension and ankle dorsiflexion.  She is able to toe walk and heel walk.  Her Romberg is positive on several cerebellar testing.  Otherwise the lower  extremity strength is full and sensation is normal in bilateral lower  extremities to light touch and pin.  The deep tendon reflexes are normal as  well.   IMPRESSION:  1.  Lumbar spinal stenosis with intermittent radicular symptoms currently      flared up status post fall.  2.   Fall, likely multifactorial.  She does have a balance deficit which may      be due to some decreased sensation.   PLAN:  1.  Will try off Darvocet and try tramadol for pain.  2.  Schedule for bilateral S1 transforaminal.  May be able to reduce oral      analgesic medications.  3.  The patient is finishing out physical therapy.  I do not think any      further outpatient therapy is needed.  She is getting home health.  4.  I will see her back for the injections.       AEK/MedQ  D:  07/06/2004 15:55:58  T:  07/07/2004 09:31:24  Job #:  [id number]

## 2011-01-22 NOTE — Assessment & Plan Note (Signed)
REFERRING PHYSICIAN:  Cristi Loron, M.D.   [name] returns today after I did a right S1 transforaminal epidural  steroid injection on October 21, 2003.  She had no post injection  complications other than feeling some hot flashes for the first few days  after the injection.  Overall, her pain levels are much improved.  She is  now a maximum of 5/10 pain and in the last 24 hours, only about 2/10.  She  does not even take Darvocet most days.  She took some last Sunday after  walking down hill.   Her functional level is good.  She is independent with her ADLs and  mobility.   Interval medical history includes nausea and vomiting and weight loss  related to a statin medication she was on and this has reportedly changed.   MEDICATIONS:  1. Valium.  2. Tylenol.  3. Nexium.  4. Darvocet, usage as above.  5. Glucosamine.  6. Cardizem.  7. Baby aspirin.  8. Premarin.  9. Atenolol.  10.      Allopurinol.   REVIEW OF SYMPTOMS:  Positive for nausea and weight loss as noted above.  No  bowel or bladder problems.  No current hot flashes except on rare occasions.  Has been, of note, taken off of Premarin since December.   PHYSICAL EXAMINATION:  VITAL SIGNS:  Blood pressure 162/78, pulse 53, O2  saturation 98% on room air, respiratory rate 16.  NEUROLOGIC:  Gait is normal.  Able to do both heel and toe walking.  Affect  is bright and alert.  Her back had some pain with extension of the spine.  No pain with forward flexion.  Lateral bending is painful more to the right  than to the left side.  Twisting is not particularly painful.  She has  normal range of motion bilateral lower extremities, normal strength  bilateral lower extremities.  Mild reduced 1/4 deep tendon reflexes  bilateral knees and ankles; 2 in the biceps, triceps, brachial radialis.   She has no tenderness to palpation bilateral lower extremities.  She has  good range of motion at the hips without pain.  Good  range of motion at the  ankles and knees.   IMPRESSION:  1. Lumbar spinal stenosis symptomatically improved following right S1     transforaminal epidural steroid injection with 40 mg of Kenalog.  2. Feel that her hot flashes may have been related to steroid effect.  3. History of nausea, improved after statin changed.   PLAN:  I will see her back in two months.  If the effects of the above  injection are tapering off, would schedule her for another injection.  I  think she could be on a q.3-4 month schedule given that we are only using 40  mg of Kenalog each time.  Consider calcium supplementation if she is not  already doing so.      Erick Colace, M.D.   AEK/MedQ  D:  11/22/2003 11:21:36  T:  11/22/2003 12:57:20  Job #:  [id number]   cc:   Cristi Loron, M.D.  [address]  [place] [address]  Fax: [phone]   Patrica Duel, M.D.  [address]  [place]  [place] [address]  Fax: [phone]   Nicki Guadalajara, M.D.  [address], [address]  [place], [place] [address]  Fax: [phone]

## 2011-01-22 NOTE — Assessment & Plan Note (Signed)
An 77 year old female with lumbar stenosis, as well as carpometacarpal  arthritis, was last seen by me January 3rd of 2008.  She has had a flare  up of her left thumb pain.  She does not think the injection helped  much, however, she had a 16 oz jar of mustard fall on her hand.  Her  back is feeling relatively well, although she feels like the injection  might be wearing off a little bit.  Her last S1 trans foraminal was  November 27th of 2007.  Pain is worse in the morning, improves with  sitting.  Her relief from meds is fair.  She can walk 10 minutes at a  time.  She can climb steps.  She does not drive.  She lives alone.  She  walks without assistance.  She has Meals on Wheels.  She has  depression/anxiety, no suicidal thoughts, bladder and bowel control  problems, chronic.   Blood pressure is 157/54, pulse is 51, respiratory rate is 16, she is  satting 98% on room air.  GENERAL:  No acute distress.  Mood and affect appropriate.  The thumb has no evidence of swelling, no point tenderness.  She has  some pain with thumb flexion, but this on the extensor surface of the  1st MCP on the left.  She has negative Finkelstein's maneuver, negative  pain over the [facility] at this point, whereas last visit the [facility] stress test  was positive on the left hand at the 1st [facility].  She has no numbness in  the hand or finger, she has no skin color changes.  She has no joint  instability.  Her back has no tenderness to palpation, she has 50%  forward flexion, 25% extension.   IMPRESSION:  1. Lumbar stenosis, relatively well controlled, Darvocet 1 p.o.      t.i.d., as well as the epidural steroid injections done every 3-4      months.  2. Thumb pain, left 1st MCP, not clearly intraarticular, maybe more of      a tendinopathy and strain.  Will give her some samples of Flector      patch that she could cut to size to wear over that area.  3. See her back in 1 month for epidural.  I believe it will be time to      reinject at that point.  4. Continue Darvocet at 100 t.i.d.      Erick Colace, M.D.  Electronically Signed     AEK/MedQ  D:  11/03/2006 16:44:26  T:  11/03/2006 18:42:12  Job #:  [id number]

## 2011-01-22 NOTE — Procedures (Signed)
NAME:  Bianca, [name]                      ACCOUNT NO.:  [id number]   MEDICAL RECORD NO.:  [id number]                   PATIENT TYPE:  REC   LOCATION:  [facility]                                  FACILITY:  [facility]   PHYSICIAN:  Erick Colace, M.D.           DATE OF BIRTH:  Dec 18, 1917   DATE OF PROCEDURE:  10/21/2003  DATE OF DISCHARGE:                                 OPERATIVE REPORT   PROCEDURE:  Right S1 transforaminal epidural steroid injection.   DESCRIPTION OF PROCEDURE:  Informed consent was obtained after explaining  risks and benefits of the procedure to the patient including the possibility  or paralysis, increased pain and side-effects from steroids.  The patient's  questions were answered, not on anticoagulants for 5 days.   The patient was placed upon fluoroscopy table.  Back was prepped.  AP view  was obtained, identifying S1 pedicle and S2 foramen.  A skin wheal was  raised with 2 mL of 1% lidocaine using a 25-gauge 1-1/2-inch needle.  A 3-  1/2-inch spinal needle with stylet was inserted into the skin and  subcutaneous tissue under fluoroscopic guidance and manipulated into the  right S1 foramen.  Placement was confirmed by AP lateral as well as contrast-  enhanced views.  Injection of Omnipaque 180 showed epidural spread.  Solution containing 2 mL of 1% methylparaben-free lidocaine plus 1 mL of 40  mg per mL of Kenalog were injected.  The patient had no complications.  See  pre- and post-injection vital sign forms.   Follow up with me in 1 month.  Post-injection instructions given.  Patient  discharged in good condition.                                                Erick Colace, M.D.    AEK/MEDQ  D:  10/21/2003 10:30:36  T:  10/21/2003 11:05:28  Job:  [id number]   cc:   Cristi Loron, M.D.  [address]  [place] [address]  Fax: [phone]   Patrica Duel, M.D.  [address]  [place]  [place] [address]  Fax: [phone]

## 2011-01-22 NOTE — Procedures (Signed)
NAMEAUDRIA, [name]            ACCOUNT NO.:  [id number]   MEDICAL RECORD NO.:  [id number]          PATIENT TYPE:  REC   LOCATION:  [facility]                          FACILITY:  [facility]   PHYSICIAN:  Erick Colace, M.D.DATE OF BIRTH:  1918/01/03   DATE OF PROCEDURE:  02/27/2007  DATE OF DISCHARGE:                               OPERATIVE REPORT   PROCEDURE:  Bilateral L5 dorsal ramus injection, bilateral L4 medial  branch block, bilateral L3 medial branch block under fluoroscopic  guidance.   INDICATIONS:  Lumbar facet-mediated pain, temporary improvements after  lumbar facet intra-articular injection.  We will assess for possible  radiofrequency, looking for positive response of two of medial branch  blocks.   Informed consent was obtained after the describing risks and benefits of  the procedure to the patient.  These include bleeding, bruising,  infection, loss of bowel or bladder function, temporary or permanent  paralysis.  She elects to proceed and has given written consent.   The patient placed prone on fluoroscopy table.  Betadine prep, sterile  drape.  A 25-gauge inch and a half needle was used to anesthetize the  skin and subcutaneous tissue with 1% lidocaine x2 mL.  Then a 22-gauge 3-  1/2 inch spinal needle was inserted first targeting the left S1 SAP-  sacral ala junction, bone contact made, confirmed with lateral imaging.  Omnipaque 180 x 0.5 mL demonstrated no intravascular uptake.  Then 0.5  mL of solution containing 1 mL of 10 mg/mL dexamethasone and 2 mL of 2%  methylparaben-free lidocaine was injected.  Then the left L5 SAP-  transverse process junction targeted, bone contact made, confirmed with  lateral imaging.  Omnipaque 180 x 0.5 mL demonstrated no intravascular  uptake and 0.5 mL of the dexamethasone and lidocaine solution was  injected.  Then the left L4 SAP-transverse junction targeted, bone  contact made, confirmed with lateral imaging.  Omnipaque 180 x  0.5 mL  demonstrated no intravascular uptake.  Then 0.5 mL of the Depo-Medrol-  lidocaine solution were injected.  The same procedure was repeated on  the right side using same injectate, same procedure and equipment.  The  patient tolerated the procedure well.  Preinjection pain level was 5/10,  postinjection 0/10.  Return in 3-4 weeks for repeat.      Erick Colace, M.D.  Electronically Signed     AEK/MEDQ  D:  02/27/2007 11:14:24  T:  02/27/2007 14:11:56  Job:  [id number]   cc:   Cristi Loron, M.D.  Fax: [phone]   Patrica Duel, M.D.  Fax: [phone]

## 2011-01-22 NOTE — Procedures (Signed)
NAME:  [name], CAVALLARO                      ACCOUNT NO.:  [id number]   MEDICAL RECORD NO.:  [id number]                   PATIENT TYPE:  REC   LOCATION:  [facility]                                  FACILITY:  [facility]   PHYSICIAN:  Erick Colace, M.D.           DATE OF BIRTH:  12-28-1917   DATE OF PROCEDURE:  04/09/2004  DATE OF DISCHARGE:                                 OPERATIVE REPORT   DATE OF BIRTH:  1918/06/14.   MEDICAL RECORD NUMBER:  [id number].   PROCEDURE:  Left S1 transforaminal epidural steroid injection.   DESCRIPTION OF PROCEDURE:  Informed consent was obtained after describing  the risks and benefits of the procedure with the patient, she wished to  proceed.  She had not had any medication changes.  She had good result from  right S1 transforaminal epidural steroid injection done on March 06, 2004.   Patient placed prone on fluoroscopy table.  Betadine prep and sterile drape.  A 1.5 inch 25 gauge needle was used to anesthetize the skin and subcu tissue  with 4 mL of 1% lidocaine.  Then under fluoroscopic guidance a 22 gauge 3.5  inch spinal needle with stylet was inserted through the skin and under  fluoroscopic manipulated into the left S1 foramen.  Placement was confirmed  by AP & lateral views as well as contrast-enhanced views and injection of  Omnipaque 180 initially showed some intravascular uptake but with needle  repositioning this resolved.   Then a solution containing 1 mL of 400 mg/mL Kenalog and 2 mL of 1%  methylparaben free lidocaine were injected.  No complications post  injection.  Vital signs stable pre and post injection.  Preinjection pain  level rated as 5/10, post injection 0.                                                Erick Colace, M.D.    AEK/MEDQ  D:  04/09/2004 16:34:16  T:  04/12/2004 15:13:34  Job:  [id number]

## 2011-01-22 NOTE — Procedures (Signed)
NAMEGENIENE, LIST            ACCOUNT NO.:  [id number]   MEDICAL RECORD NO.:  [id number]          PATIENT TYPE:  REC   LOCATION:  [facility]                          FACILITY:  [facility]   PHYSICIAN:  Erick Colace, M.D.DATE OF BIRTH:  1918/09/06   DATE OF PROCEDURE:  09/23/2005  DATE OF DISCHARGE:                                 OPERATIVE REPORT   PROCEDURE:  Left S1 transforaminal epidural steroid injection under  fluoroscopic guidance.   INDICATIONS FOR PROCEDURE:  Lumbar stenosis with chronic radiculitis,  previous improvements after S1 transforaminal epidural steroid injection.  Pain is only partially relieved by narcotic analgesics.   Informed consent was obtained after describing risks and benefits of the  procedure to the patient.  These include bleeding, bruising, infection, loss  of bowel and bladder function, temporary or permanent paralysis.  She elects  to proceed and has given written consent.  Patient placed prone on  fluoroscopy table. Betadine prep and sterile drape.  A 25-gauge 1-1/2 inch  needle to anesthetize skin and subcutaneous tissues with 1% lidocaine x2 mL.  Then a 22-gauge 3-1/2 inch spinal needle was inserted in the left S1 foramen  under fluoroscopic guidance using both AP and lateral imaging.  Omnipaque  180 under live fluoroscopy initially demonstrated intravascular uptake but  the needle was slightly withdrawn and good epidural flow was established and  then a solution containing 1 mL of 40 mg/mL Depo-Medrol plus 2 mL of 1%  methylparaben-free lidocaine were injected.  The patient tolerated the  procedure well.  Post injection instructions given.  Patient will return in  one month for possible reinjection of left S1 versus right side if that  becomes more symptomatic as it has in the past.      Erick Colace, M.D.  Electronically Signed     AEK/MEDQ  D:  09/23/2005 16:28:19  T:  09/24/2005 08:51:20  Job:  [id number]

## 2011-01-22 NOTE — Procedures (Signed)
NAMEALLEXA, ACOFF NO.:  [id number]   MEDICAL RECORD NO.:  [id number]          PATIENT TYPE:  REC   LOCATION:  [facility]                          FACILITY:  [facility]   PHYSICIAN:  Erick Colace, M.D.DATE OF BIRTH:  03/14/1918   DATE OF PROCEDURE:  01/14/2005  DATE OF DISCHARGE:                                 OPERATIVE REPORT   MEDICAL RECORD NUMBER:  [id number].   PROCEDURE:  bilateral S1 transforaminal epidural steroid injection under  fluoroscopic guidance.   Informed consent was obtained after describing risks and benefits of the  procedure to the patient. These include bleeding, bruising, infection, loss  of bowel and bladder function, temporary or permanent paralysis, and she  elects to proceed.   INDICATIONS:  She has a history of lumbar stenosis, intermittent radicular  symptoms, previous good relief with S1 transforaminal epidural steroid  injection, last one done July 20, 2004.   The patient placed prone on fluoroscopy table, Betadine prep, sterile drape.  A 25-gauge 1-1/4-inch needle; 1 cc of 1% lidocaine was injected into each  site. A 22-gauge 3-1/2-inch spinal needle was inserted first into the left  S1 transforamen and then into the right S1 foramen under fluoroscopic  guidance. Omnipaque 180 under live fluoro demonstrated no intervascular  uptake and appropriate epidural flow. Then a solution containing 1.5 cc of  40 mg/cc Kenalog plus 2.5 cc of lidocaine 1% methylparaben free 2 cc were  injected into each side. The patient tolerated the procedure well. Post  injection instructions given.      AEK/MEDQ  D:  01/14/2005 17:54:08  T:  01/15/2005 07:42:09  Job:  [id number]

## 2011-01-22 NOTE — Procedures (Signed)
Bianca, [name] NO.:  [id number]   MEDICAL RECORD NO.:  [id number]          PATIENT TYPE:  REC   LOCATION:  [facility]                          FACILITY:  [facility]   PHYSICIAN:  Erick Colace, M.D.DATE OF BIRTH:  1918-01-16   DATE OF PROCEDURE:  09/08/2006  DATE OF DISCHARGE:  12/06/2006                               OPERATIVE REPORT   Thursday, September 08, 2006   PROCEDURE:  Left first carpal/metacarpal injection dated 09/08/06.   INDICATIONS FOR PROCEDURE:  Carpal/metacarpal joint arthritis  unresponsive to analgesic medications.  X-rays demonstrating  carpal/metacarpal arthritis status post fall.   Informed consent was obtained after describing risks and benefits  including, bleeding, bruising, infection.  She elected to proceed.  A  small skin wheal was raised using 27 gauge needle, 0.5 cc of 1%  lidocaine was injected followed by insertion of a 27 gauge 5 inch needle  into the joint and a solution containing 0.25 cc of 40 mg/cc Depo-Medrol  plus 0.5 cc of 1% lidocaine was injected.  The patient tolerated the  procedure well.  Post injection instructions given.      Erick Colace, M.D.  Electronically Signed     AEK/MEDQ  D:  03/02/2007 07:36:35  T:  03/02/2007 10:04:07  Job:  [id number]   cc:   Victorino Dike, PBS, [phone] [place]

## 2011-01-22 NOTE — Procedures (Signed)
NAME:  [name], [name] NO.:  [id number]   MEDICAL RECORD NO.:  [id number]          PATIENT TYPE:  REC   LOCATION:  [facility]                          FACILITY:  [facility]   PHYSICIAN:  Erick Colace, M.D.DATE OF BIRTH:  1918/04/14   DATE OF PROCEDURE:  DATE OF DISCHARGE:                                 OPERATIVE REPORT   PROCEDURE PERFORMED:  Left transforaminal epidural steroid injection under  fluoroscopic guidance.   INDICATIONS FOR PROCEDURE:  Left lower extremity radicular pain related to  lumbar stenosis.  Previous good results with injections.  Last injection was  09/12/05.   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  She is not on any  anticoagulant medications.   Informed consent was obtained after describing the risks and benefits of the  procedure to patient.  These include the risks of infection, loss of bowel  and bladder function, temporary or permanent paralysis and she elects to  proceed.   Patient placed prone on fluoroscopy table.  Betadine prep and sterile drape.  25 gauge 1-1/2 inch needle was used to anesthetized skin and subcutaneous  with 1% lidocaine with epinephrine x 3 mL and a 22 gauge 3-1/2 inch spinal  needle was inserted in the left S1 foramen under fluoroscopic guidance.  Omnipaque 180 under live fluoroscopy demonstrated no intravascular uptake  and then with good foraminal spread, then solution containing 1 mL of 40 mg  per mL of Depo-Medrol plus 2 mL of 1% lidocaine were injected.  The patient  tolerated the procedure well. She had some lower extremity radicular pain  during injection which resolved after slowing down the rate of injection.  Pre and post injection vitals stable.  Post injection instructions given.  Pre injection pain level 7/10, post injection 3/10.  Return in three months  for repeat injection.      Erick Colace, M.D.  Electronically Signed     AEK/MEDQ  D:  01/28/2006 11:24:55  T:  01/29/2006 08:30:39  Job:  [id number]

## 2011-01-22 NOTE — Consult Note (Signed)
NAME:  [name], [name]                      ACCOUNT NO.:   MEDICAL RECORD NO.:  [id number]                   PATIENT TYPE:   LOCATION:                                       FACILITY:  [facility]   PHYSICIAN:  Lionel December, M.D.                 DATE OF BIRTH:  [birth year]   DATE OF CONSULTATION:  DATE OF DISCHARGE:                                   CONSULTATION   REASON FOR CONSULTATION:  Gastroesophageal reflux disease.   HISTORY OF PRESENT ILLNESS:  The patient is a pleasant 78 year old Caucasian  female who presents at the request of Dr. Corrie Mckusick for further  evaluation of gastroesophageal reflux disease.  She states that after taking  doxycycline and Trimox for cat scratch, she developed a bitter taste in her  mouth.  She states that this bitter taste has continued over the past three  months.  She has also had an episode of thrush that required antifungal  treatment.  She has developed problems swallowing, primarily solids.  She  complains of nausea but no vomiting.  She has only occasional heartburn.  She denies any abdominal pain, constipation, diarrhea, melena or rectal  bleeding.  She was started on Nexium and took about one month's worth, which  she states helped some.  She gives a history of ruptured hiatal hernia more  than 20 years ago.  She has seen Dr. Welton Flakes in the past.  She also complains  of weight loss of approximately 20 pounds in the last one year; she relates  some of this secondary to the loss of her husband.  She has never had a  colonoscopy.   CURRENT MEDICATIONS:  1. Nexium 40 mg q.d.  2. Valium 5 mg q.d. p.r.n.  3. Glucosamine q.d.  4. Lasix 40 mg q.d.  5. Diltiazem 350 mg q.d.  6. Atenolol 25 mg q.d.  7. Premarin 0.9 mg q.d.  8. Baby aspirin 81 mg q.d.  9. Vitamin C 500 mg q.d.  10.      Mylanta p.r.n.   ALLERGIES:  DOXYCYCLINE caused GI upset and MACRODANTIN causes GI upset.  REGLAN causes weakness.   PAST MEDICAL HISTORY:  1. Anxiety.  2. Hypertension.  3. Hormone replacement therapy.  4. Gastroesophageal reflux disease.  5. History of urethral dilatations.  6. Chronic back pain secondary to cyst on spine, has been receiving     intermittent epidural shots.  7. Gives history of heart disease, followed by Dr. Lennette Bihari.  Denies     any previous stress tests or cardiac catheterization.  States that she     had palpitations related to anxiety/stress around the death of her     husband.   PAST SURGICAL HISTORY:  Hiatal hernia, she states it ruptured and required a  partial gastrectomy over 20 years ago; appendectomy; hysterectomy; left eye  surgery two months ago for scar tissue.   FAMILY  HISTORY:  Mother died of kidney disease.  Father died of lung cancer.  No family history of colorectal cancer or chronic GI illnesses.   SOCIAL HISTORY:  She has been widowed since March 2002.  She has one adopted  daughter.  She is retired.  Denies any alcohol or tobacco use.   REVIEW OF SYSTEMS:  Please see HPI for GI.  GENERAL:  Weight loss of 20  pounds in 1 year.  CARDIOPULMONARY:  Denies any chest pain or shortness of  breath, however, is followed by Dr. Tresa Endo for reasons unclear.  No history  of cardiac catheterization or stress tests.  GU:  History of urethral  dilatations.   PHYSICAL EXAMINATION:  VITAL SIGNS:  Weight 179.  Height 5 feet 5-1/2  inches.  Temperature 96.6, blood pressure 150/70, pulse 76.  GENERAL:  A very pleasant, well-nourished, well-developed, elderly Caucasian  female in no acute distress.  SKIN:  Warm and dry.  No jaundice.  HEENT:  Conjunctivae are pink.  Sclerae nonicteric.  Pupils equal, round and  reactive to light.  Oropharyngeal mucosa moist and pink.  No lesions,  erythema or exudate.  NECK:  No lymphadenopathy or thyromegaly.  CHEST:  Chest is clear to auscultation.  CARDIAC:  Exam reveals regular rate and rhythm, normal S1 and S2, no  murmurs, rubs, or gallops.  ABDOMEN:  Positive  bowel sounds.  Soft, nontender and nondistended.  No  organomegaly or masses.  EXTREMITIES:  No edema.   LABORATORY AND ACCESSORY CLINICAL DATA:  On February 05, 2002, abdominal  ultrasound revealed a small right renal cyst, status post cholecystectomy,  common bile duct measured 5.5 mm.   Labs on February 05, 2002 revealed a white count of 10.7, hemoglobin 12.8,  hematocrit 41.8, platelets 371,000; alkaline phosphatase 134, total  bilirubin 0.4, SGOT 15, SGPT 16, albumin 3.8.   IMPRESSION:  The patient is a pleasant 77 year old Caucasian female with a  three-month history of bitter taste in her mouth, nausea, occasional  heartburn and dysphagia to solids which she states all occurred after taking  antibiotics after getting scratched by her cat; she also describes an  episode of thrush related to this antibiotic use.  Her symptoms could all be  secondary to gastroesophageal reflux disease.  I doubt she has esophageal  candidiasis with no odynophagia.  Dysphagia may be secondary to esophageal  web, ring or stricture or poorly controlled gastroesophageal reflux disease.  Given persistence of her symptoms and limited response to Nexium, I have  recommended an esophagogastroduodenoscopy for further evaluation at this  time.  We will plan for esophageal dilatation as seen necessary at time of  exam.  We did discuss today a possibility of colonoscopy at some point in  the future.  At this time, she prefers to postpone, which I feel is  reasonable, given the extent of her upper gastrointestinal symptoms at this  time.   PLAN:  1. Esophagogastroduodenoscopy with dilatation in the near future.  2. Discuss antireflux measures, information sheet given to the patient.  3. She will continue Nexium 40 mg daily for now.  4.     Further recommendations to follow.  5. I have recommended colonoscopy at some point in the near future.  I would like to thank Dr. Phillips Odor for allowing [place] to take part in the  care  of this patient.      Tana Coast, P.A.  Lionel December, M.D.    LL/MEDQ  D:  05/14/2002  T:  05/15/2002  Job:  [id number]   cc:   Corrie Mckusick, M.D.  [address]  [place]  [place] [address]  Fax: [phone]

## 2011-01-22 NOTE — Assessment & Plan Note (Signed)
MEDICAL RECORD NUMBER:  [id number].   Ms. [name] returns today. She is scheduled for bilateral S1 transforaminal  epidural steroid injections for her lumbar spinal stenosis with  radiculopathy; however, she had a fall which was related to blood pressure  medication. She had a drop of 30 points and has further evaluation per her  primary doctor, Dr. Nobie Putnam. X-rays were done. She does not report any new  fractures. She did have a MRI of her brain today, and I do not have any  results of that study. She denies any loss of consciousness.   She had been taking Darvocet-N 100 three times a day. Denies any dizziness  from this. She also takes one Tylenol P.M. at night.   Her pain level is about 8/10 on average. Relief from medications is about  80%. She improves with rest. She can walk 10 to 15 minutes. She cannot climb  a lot of steps and no longer drives. Her daughter drives for her.   She is widowed, she lives alone, but her daughter checks in frequently with  her.   PHYSICAL EXAMINATION:  VITAL SIGNS:  Blood pressure 140/56, pulse 57,  respiratory rate 16, O2 saturation 96% on room air.  GENERAL:  No acute distress. Mood and affect appropriate. She is accompanied  by her daughter.   Back is nontender to palpation. She is able to forward flex and extend but  with limited range of motion. Extension does cause pain.   REVIEW OF X-RAYS:  She does have a lumbar sacral spine that is in the  computer, and it does show hypertrophic facet changes, L3-4, 4-5, L5-S1;  degenerative disk as well; no compression fractures.   IMPRESSION:  1.  History of lumbar stenosis with intermittent radicular symptoms.  2.  Fall. She has had falls in the past and perhaps now a reason has been      identified in terms of her blood pressure medications. She has had some      change from blood pressure medications, noting from her medication list      she is on at least four different ones.   I will see her  back in about two weeks for the injection. I would like to  have her workup complete in terms of her MRI, etc., and then proceed on if  she is stable for next injection.      AEK/MedQ  D:  12/24/2004 14:49:40  T:  12/24/2004 15:54:56  Job #:  [id number]   cc:   Patrica Duel, M.D.  [address]  [place]  [place] [address]  Fax: [phone]

## 2011-06-10 LAB — DIFFERENTIAL
Basophils Absolute: 0 10*3/uL (ref 0.0–0.1)
Basophils Absolute: 0.1 10*3/uL (ref 0.0–0.1)
Basophils Absolute: 0.2 10*3/uL — ABNORMAL HIGH (ref 0.0–0.1)
Basophils Relative: 1 % (ref 0–1)
Eosinophils Absolute: 0 10*3/uL (ref 0.0–0.7)
Eosinophils Absolute: 0.1 10*3/uL (ref 0.0–0.7)
Eosinophils Relative: 0 % (ref 0–5)
Eosinophils Relative: 0 % (ref 0–5)
Lymphocytes Relative: 10 % — ABNORMAL LOW (ref 12–46)
Lymphocytes Relative: 8 % — ABNORMAL LOW (ref 12–46)
Lymphocytes Relative: 9 % — ABNORMAL LOW (ref 12–46)
Lymphs Abs: 2.1 10*3/uL (ref 0.7–4.0)
Monocytes Absolute: 1.5 10*3/uL — ABNORMAL HIGH (ref 0.1–1.0)
Monocytes Absolute: 2.2 10*3/uL — ABNORMAL HIGH (ref 0.1–1.0)
Monocytes Absolute: 2.5 10*3/uL — ABNORMAL HIGH (ref 0.1–1.0)
Monocytes Relative: 8 % (ref 3–12)
Neutro Abs: 12.2 10*3/uL — ABNORMAL HIGH (ref 1.7–7.7)
Neutro Abs: 14.6 10*3/uL — ABNORMAL HIGH (ref 1.7–7.7)
Neutrophils Relative %: 76 % (ref 43–77)

## 2011-06-10 LAB — BASIC METABOLIC PANEL
BUN: 11 mg/dL (ref 6–23)
BUN: 17 mg/dL (ref 6–23)
BUN: 21 mg/dL (ref 6–23)
CO2: 24 mEq/L (ref 19–32)
CO2: 25 mEq/L (ref 19–32)
Calcium: 8.6 mg/dL (ref 8.4–10.5)
Chloride: 100 mEq/L (ref 96–112)
Creatinine, Ser: 0.86 mg/dL (ref 0.4–1.2)
Creatinine, Ser: 0.95 mg/dL (ref 0.4–1.2)
Creatinine, Ser: 1.13 mg/dL (ref 0.4–1.2)
GFR calc non Af Amer: 56 mL/min — ABNORMAL LOW (ref >60)
Glucose, Bld: 115 mg/dL — ABNORMAL HIGH (ref 70–99)
Glucose, Bld: 128 mg/dL — ABNORMAL HIGH (ref 70–99)
Potassium: 4 mEq/L (ref 3.5–5.1)

## 2011-06-10 LAB — CBC
HCT: 35.6 % — ABNORMAL LOW (ref 36.0–46.0)
HCT: 39.7 % (ref 36.0–46.0)
Hemoglobin: 11.2 g/dL — ABNORMAL LOW (ref 12.0–15.0)
Hemoglobin: 13 g/dL (ref 12.0–15.0)
MCHC: 33.2 g/dL (ref 30.0–36.0)
MCV: 94.6 fL (ref 78.0–100.0)
MCV: 95.5 fL (ref 78.0–100.0)
Platelets: 220 10*3/uL (ref 150–400)
RBC: 3.54 MIL/uL — ABNORMAL LOW (ref 3.87–5.11)
RBC: 4.15 MIL/uL (ref 3.87–5.11)
RDW: 14.7 % (ref 11.5–15.5)
RDW: 15 % (ref 11.5–15.5)
RDW: 15.1 % (ref 11.5–15.5)
WBC: 20.2 10*3/uL — ABNORMAL HIGH (ref 4.0–10.5)

## 2011-06-10 LAB — URINE CULTURE: Special Requests: NEGATIVE

## 2011-06-10 LAB — URINALYSIS, ROUTINE W REFLEX MICROSCOPIC
Bilirubin Urine: NEGATIVE
Glucose, UA: NEGATIVE mg/dL
Hgb urine dipstick: NEGATIVE
Ketones, ur: NEGATIVE mg/dL
pH: 6 (ref 5.0–8.0)

## 2011-06-10 LAB — URINE MICROSCOPIC-ADD ON

## 2011-06-10 LAB — GLUCOSE, CAPILLARY: Glucose-Capillary: 102 mg/dL — ABNORMAL HIGH (ref 70–99)

## 2011-09-06 ENCOUNTER — Encounter: Payer: Medicare Other | Attending: Physical Medicine & Rehabilitation

## 2011-09-06 ENCOUNTER — Ambulatory Visit: Payer: Medicare Other | Admitting: Physical Medicine & Rehabilitation

## 2011-09-06 DIAGNOSIS — M47817 Spondylosis without myelopathy or radiculopathy, lumbosacral region: Secondary | ICD-10-CM

## 2011-09-06 DIAGNOSIS — M4120 Other idiopathic scoliosis, site unspecified: Secondary | ICD-10-CM

## 2011-09-06 DIAGNOSIS — M549 Dorsalgia, unspecified: Secondary | ICD-10-CM | POA: Insufficient documentation

## 2011-09-06 NOTE — Procedures (Signed)
[name], [name]            ACCOUNT NO.:  [id number]  MEDICAL RECORD NO.:  [id number]           PATIENT TYPE:  O  LOCATION:  [facility]                          FACILITY:  [facility]  PHYSICIAN:  Erick Colace, M.D.DATE OF BIRTH:  1918/03/02  DATE OF PROCEDURE:  09/06/2011 DATE OF DISCHARGE:                              OPERATIVE REPORT  PROCEDURE:  Bilateral L5 dorsal ramus, bilateral L2, L3, L4 medial branch blocks under fluoroscopic guidance.  INDICATION:  Lumbar spondylosis with axial back pain.  She had over 1 year improvement after last set of medial branch blocks.  Her pain is on both sides of the back, it was primarily on the left before but it spread more.  Pain is only partially response to other medications.  She rates her pain as 8/10.  Informed consent was obtained after describing risks and benefits of the procedure with the patient.  These include bleeding, bruising, and infection.  She elects to proceed and has given written consent.  The patient placed prone on fluoroscopy table.  Betadine prep, sterile drape, a 25-gauge inch and half needle was used to anesthetize the skin and subcu tissue, 1% lidocaine x2 mL.  Then, a 22-gauge 3.5 inch spinal needle was inserted under fluoroscopic guidance starting the right S1 SAP sacral ala junction, bone contact made and confirmed with lateral imaging.  Omnipaque 180 x0.5 mL demonstrated no intravascular uptake. Then 0.5 mL of dexamethasone lidocaine solution was injected.  This consisted of 1 mL of 4 mg/mL dexamethasone and 4 mL of 2% MPF lidocaine. Then, the right L5 SAP transverse process junction targeted, bone contact made, confirmed with lateral imaging.  Omnipaque 180 x 0.5 mL demonstrated no intravascular uptake.  Then, 0.5 mL of the dexamethasone lidocaine solution was injected.  Then, the right L4 SAP transverse process junction targeted, bone contact made.  Omnipaque 180 x0.5 mL demonstrated no intravascular  uptake.  Then, 0.5 mL of the dexamethasone lidocaine solution was injected.  Then, the right L3 SAP transverse process junction targeted, bone contact made.  Omnipaque 180 x 0.5 mL demonstrated no intravascular uptake.  Then, 0.5 mL of the dexamethasone lidocaine solution was injected.  The same procedure was repeated on left side using same needle, injectate, and technique.  The patient tolerated procedure well.  Postprocedure instructions given.     Erick Colace, M.D. Electronically Signed    AEK/MEDQ  D:  09/06/2011 12:42:34  T:  09/06/2011 19:58:11  Job:  [id number]

## 2011-09-28 ENCOUNTER — Ambulatory Visit: Payer: Medicare Other | Admitting: Physical Medicine & Rehabilitation

## 2011-09-30 ENCOUNTER — Encounter: Payer: Medicare Other | Attending: Physical Medicine & Rehabilitation

## 2011-09-30 ENCOUNTER — Ambulatory Visit: Payer: Medicare Other | Admitting: Physical Medicine & Rehabilitation

## 2011-09-30 DIAGNOSIS — M549 Dorsalgia, unspecified: Secondary | ICD-10-CM | POA: Insufficient documentation

## 2011-09-30 DIAGNOSIS — M47817 Spondylosis without myelopathy or radiculopathy, lumbosacral region: Secondary | ICD-10-CM | POA: Insufficient documentation

## 2011-09-30 DIAGNOSIS — M545 Low back pain: Secondary | ICD-10-CM

## 2011-09-30 DIAGNOSIS — M25569 Pain in unspecified knee: Secondary | ICD-10-CM

## 2011-09-30 DIAGNOSIS — M533 Sacrococcygeal disorders, not elsewhere classified: Secondary | ICD-10-CM

## 2011-09-30 NOTE — Assessment & Plan Note (Signed)
REASON FOR VISIT:  Back pain.  A 78 year old female, who was last seen by me September 06, 2011.  She has a history of lumbar pain.  She has evidence of lumbar spondylosis, has had medial branch blocks.  Last visit, she does have multilevel degenerative disk, seen on spine x-rays 2 years ago, L3-4, L4-5, L5-S1, but the most marked finding with the hypertrophic facet arthropathy. Unfortunately, she did not respond to medial branch blocks.  Her pain is in her upper buttock area.  She has had no falls or trauma.  The pain is around 7/10.  She can climb steps.  She does not drive.  She lives alone.  She has somebody come in 3 times a day for 3 hours per day to help her with household duties.  REVIEW OF SYSTEMS:  Positive for depression and anxiety.  PHYSICAL EXAMINATION:  VITAL SIGNS:  Blood pressure 165/44, pulse 74, respiratory rate 14 and O2 sat 97% on room air.  Weight 149 pounds, 5 feet, 5.5 inches. GENERAL:  Well-developed, well-nourished elderly female, in no acute distress.  Mood and affect are appropriate. BACK:  Her back has tenderness over the PSIS bilaterally.  No pain in the upper lumbar area.  No pain over the hips.  Hip, knee and ankle range of motion are normal. EXTREMITIES:  She complains that she has some knee pain, but she has normal range of motion.  No pain to palpation.  No evidence of effusion of the left knee.  Gait shows no evidence of toe drag or knee instability.  She has normal lower extremity strength.  IMPRESSION:  Lumbar pain, unfortunately not responsive to medial branch blocks.  We talked about other alternative treatments and alternative diagnoses.  The one that is most likely given her symptoms and location of pain is the sacroiliac joint.  We will set her up for bilateral injections 2-3 weeks.  Discussed with the patient, agrees with plan.     Erick Colace, M.D. Electronically Signed    AEK/MedQ D:  09/30/2011 14:07:12  T:  09/30/2011  18:57:24  Job #:  [id number]

## 2011-10-18 ENCOUNTER — Encounter: Payer: Medicare Other | Attending: Physical Medicine & Rehabilitation

## 2011-10-18 ENCOUNTER — Ambulatory Visit: Payer: Medicare Other | Admitting: Physical Medicine & Rehabilitation

## 2011-10-18 DIAGNOSIS — M545 Low back pain: Secondary | ICD-10-CM

## 2011-10-18 DIAGNOSIS — M533 Sacrococcygeal disorders, not elsewhere classified: Secondary | ICD-10-CM | POA: Insufficient documentation

## 2011-10-18 DIAGNOSIS — M549 Dorsalgia, unspecified: Secondary | ICD-10-CM | POA: Insufficient documentation

## 2011-10-18 DIAGNOSIS — M47817 Spondylosis without myelopathy or radiculopathy, lumbosacral region: Secondary | ICD-10-CM | POA: Insufficient documentation

## 2011-10-19 NOTE — Procedures (Signed)
NAMEAVONELL, LENIG            ACCOUNT NO.:  [id number]  MEDICAL RECORD NO.:  [id number]           PATIENT TYPE:  O  LOCATION:  [facility]                          FACILITY:  [facility]  PHYSICIAN:  Erick Colace, M.D.DATE OF BIRTH:  1918-02-12  DATE OF PROCEDURE: DATE OF DISCHARGE:                              OPERATIVE REPORT  REASON FOR VISIT:  Back pain only partially responsive medication management and other conservative care, interfering with activities.  A 78 year old female who failed lumbar medial branch blocks.  She has lumbar spondylosis, but also has some SI spondylosis here for SI injection.  Left side is worse than right side corresponding with fluoroscopic views.  Informed consent was obtained after describing risks and benefits of the procedure with the patient.  These include bleeding, bruising, and infection.  She elects to proceed and has given written consent.  Her daughter is driving her today.  Her pain is rated 7-8/10.  The patient was placed prone on fluoroscopy table.  Betadine prep, sterile drape, 25-gauge inch and half needle was used to anesthetize the skin and subcutaneous tissue with 1% lidocaine x2 mL.  Then, a 25-gauge 3-inch spinal needle was inserted under fluoroscopic guidance into the right SI joint.  AP, lateral, and oblique images utilized.  Omnipaque 180 x0.5 mL demonstrated good joint outline followed by injection of 1 mL of 2% MPF lidocaine and 0.5 mL of 40 mg/mL Depo-Medrol.  The same procedure was repeated on the left side using same needle, injectate, and technique.  The patient tolerated the procedure well.  Pre and post injection vitals stable.  Postop instructions given.  Please see post procedure sheet to look at pre and post injection pain levels.     Erick Colace, M.D. Electronically Signed    AEK/MEDQ  D:  10/18/2011 14:06:53  T:  10/19/2011 00:11:12  Job:  [id number]

## 2012-01-10 ENCOUNTER — Encounter: Payer: Medicare Other | Attending: Physical Medicine & Rehabilitation

## 2012-01-10 ENCOUNTER — Ambulatory Visit (HOSPITAL_BASED_OUTPATIENT_CLINIC_OR_DEPARTMENT_OTHER): Payer: Medicare Other | Admitting: Physical Medicine & Rehabilitation

## 2012-01-10 ENCOUNTER — Encounter: Payer: Self-pay | Admitting: Physical Medicine & Rehabilitation

## 2012-01-10 VITALS — BP 155/49 | HR 63 | Resp 16 | Ht 65.5 in | Wt 150.0 lb

## 2012-01-10 DIAGNOSIS — M533 Sacrococcygeal disorders, not elsewhere classified: Secondary | ICD-10-CM

## 2012-01-10 DIAGNOSIS — M549 Dorsalgia, unspecified: Secondary | ICD-10-CM | POA: Insufficient documentation

## 2012-01-10 DIAGNOSIS — M47817 Spondylosis without myelopathy or radiculopathy, lumbosacral region: Secondary | ICD-10-CM | POA: Insufficient documentation

## 2012-01-10 NOTE — Patient Instructions (Signed)
Sacroiliac Joint Dysfunction The sacroiliac joint connects the lower part of the spine (the sacrum) with the bones of the pelvis. CAUSES  Sometimes, there is no obvious reason for sacroiliac joint dysfunction. Other times, it may occur   During pregnancy.   After injury, such as:   Car accidents.   Sport-related injuries.   Work-related injuries.   Due to one leg being shorter than the other.   Due to other conditions that affect the joints, such as:   Rheumatoid arthritis.   Gout.   Psoriasis.   Joint infection (septic arthritis).  SYMPTOMS  Symptoms may include:  Pain in the:   Lower back.   Buttocks.   Groin.   Thighs and legs.   Difficult sitting, standing, walking, lying, bending or lifting.  DIAGNOSIS  A number of tests may be used to help diagnose the cause of sacroiliac joint dysfunction, including:  Imaging tests to look for other causes of pain, including:   MRI.   CT scan.   Bone scan.   Diagnostic injection: During a special x-ray (called fluoroscopy), a needle is put into the sacroiliac joint. A numbing medicine is injected into the joint. If the pain is improved or stopped, the diagnosis of sacroiliac joint dysfunction is more likely.  TREATMENT  There are a number of types of treatment used for sacroiliac joint dysfunction, including:  Only take over-the-counter or prescription medicines for pain, discomfort, or fever as directed by your caregiver.   Medications to relax muscles.   Rest. Decreasing activity can help cut down on painful muscle spasms and allow the back to heal.   Application of heat or ice to the lower back may improve muscle spasms and soothe pain.   Brace. A special back brace, called a sacroiliac belt, can help support the joint while your back is healing.   Physical therapy can help teach comfortable positions and exercises to strengthen muscles that support the sacroiliac joint.   Cortisone injections. Injections  of steroid medicine into the joint can help decrease swelling and improve pain.   Hyaluronic acid injections. This chemical improves lubrication within the sacroiliac joint, thereby decreasing pain.   Radiofrequency ablation. A special needle is placed into the joint, where it burns away nerves that are carrying pain messages from the joint.   Surgery. Because pain occurs during movement of the joint, screws and plates may be installed in order to limit or prevent joint motion.  HOME CARE INSTRUCTIONS   Take all medications exactly as directed.   Follow instructions regarding both rest and physical activity, to avoid worsening the pain.   Do physical therapy exercises exactly as prescribed.   PLEASE MAKE RECORD OF THE AMOUNT OF PAIN RELIEF YOU GET WITH THE INJECTION SEEK IMMEDIATE MEDICAL CARE IF:  You experience increasingly severe pain.   You develop new symptoms, such as numbness or tingling in your legs or feet.   You lose bladder or bowel control.  Document Released: 11/19/2008 Document Revised: 08/12/2011 Document Reviewed: 11/19/2008 [facility] Patient Information 2012 [place], [place].

## 2012-01-10 NOTE — Progress Notes (Signed)
  PROCEDURE RECORD [facility]   Name: [name] DOB:March 30, 1918 MRN: [id number]  Date:01/10/2012  Physician: Claudette Laws, MD    Nurse/CMA: Redgie Grayer  Allergies:  Allergies  Allergen Reactions  . Metoclopramide Hcl     Consent Signed: yes  Is patient diabetic? no   Pregnant: no LMP: No LMP recorded. (age 78-55)  Anticoagulants: no Anti-inflammatory: no Antibiotics: no  Procedure: Sacroiliac Injection  Position: Prone Start Time: 1:56pm  End Time: 2:02pm  Fluoro Time: 16 seconds  RN/CMA Carroll,CMA Carroll,CMA    Time 1:41pm 2:05pm    BP 155/49 194/89    Pulse 63 64    Respirations 16 16    O2 Sat 98% 98%    S/S 6 6    Pain Level 7/10 6/10     D/C home with Eunice Blase (daughter), patient A & O X 3, D/C instructions reviewed, and sits independently.

## 2012-01-10 NOTE — Progress Notes (Signed)
Bilateral sacroiliac injections under fluoroscopic guidance  Indication: Low back and buttocks pain not relieved by medication management and other conservative care.  Informed consent was obtained after describing risks and benefits of the procedure with the patient, this includes bleeding, bruising, infection, paralysis and medication side effects. The patient wishes to proceed and has given written consent. The patient was placed in a prone position. The lumbar and sacral area was marked and prepped with Betadine. A 25-gauge 1-1/2 inch needle was inserted into the skin and subcutaneous tissue and 1 mL of 1% lidocaine was injected into each side. Then a 25-gauge 3 inch spinal needle was inserted under fluoroscopic guidance into the left sacroiliac joint. AP and lateral images were utilized. Omnipaque 180x0.5 mL under live fluoroscopy demonstrated no intravascular uptake. Then a solution containing one ML of 40 mg per mL Depakote met drawl in 2 ML of 2% lidocaine MPF was injected x1.5 mL. This same procedure was repeated on the right side using the same needle, injectate, and technique. Patient tolerated the procedure well. Post procedure instructions were given. Please see post procedure form. 

## 2012-02-29 ENCOUNTER — Ambulatory Visit: Payer: Medicare Other | Admitting: Physical Medicine & Rehabilitation

## 2012-03-07 ENCOUNTER — Ambulatory Visit (HOSPITAL_BASED_OUTPATIENT_CLINIC_OR_DEPARTMENT_OTHER): Payer: Medicare Other | Admitting: Physical Medicine & Rehabilitation

## 2012-03-07 ENCOUNTER — Encounter: Payer: Self-pay | Admitting: Physical Medicine & Rehabilitation

## 2012-03-07 ENCOUNTER — Encounter: Payer: Medicare Other | Attending: Physical Medicine & Rehabilitation

## 2012-03-07 VITALS — BP 180/60 | HR 69 | Resp 14 | Ht 65.0 in | Wt 152.0 lb

## 2012-03-07 DIAGNOSIS — M533 Sacrococcygeal disorders, not elsewhere classified: Secondary | ICD-10-CM | POA: Insufficient documentation

## 2012-03-07 DIAGNOSIS — M549 Dorsalgia, unspecified: Secondary | ICD-10-CM | POA: Insufficient documentation

## 2012-03-07 DIAGNOSIS — M47817 Spondylosis without myelopathy or radiculopathy, lumbosacral region: Secondary | ICD-10-CM | POA: Insufficient documentation

## 2012-03-07 NOTE — Progress Notes (Signed)
Right sacroiliac injection under fluoroscopic guidance  Indication: Right Low back and buttocks pain not relieved by medication management and other conservative care.  Informed consent was obtained after describing risks and benefits of the procedure with the patient, this includes bleeding, bruising, infection, paralysis and medication side effects. The patient wishes to proceed and has given written consent. The patient was placed in a prone position. The lumbar and sacral area was marked and prepped with Betadine. A 25-gauge 1-1/2 inch needle was inserted into the skin and subcutaneous tissue and 1 mL of 1% lidocaine was injected. Then a 25-gauge 3 inch spinal needle was inserted under fluoroscopic guidance into the Right sacroiliac joint. AP and lateral images were utilized. Omnipaque 180x0.5 mL under live fluoroscopy demonstrated no intravascular uptake. Then a solution containing one ML of 40 mg per mL depomedrol and 2 ML of 1% lidocaine MPF was injected x1.5 mL. Patient tolerated the procedure well. Post procedure instructions were given. Please see post procedure form. 

## 2012-03-07 NOTE — Patient Instructions (Signed)
Sacroiliac Joint Dysfunction The sacroiliac joint connects the lower part of the spine (the sacrum) with the bones of the pelvis. CAUSES  Sometimes, there is no obvious reason for sacroiliac joint dysfunction. Other times, it may occur   During pregnancy.   After injury, such as:   Car accidents.   Sport-related injuries.   Work-related injuries.   Due to one leg being shorter than the other.   Due to other conditions that affect the joints, such as:   Rheumatoid arthritis.   Gout.   Psoriasis.   Joint infection (septic arthritis).  SYMPTOMS  Symptoms may include:  Pain in the:   Lower back.   Buttocks.   Groin.   Thighs and legs.   Difficult sitting, standing, walking, lying, bending or lifting.  DIAGNOSIS  A number of tests may be used to help diagnose the cause of sacroiliac joint dysfunction, including:  Imaging tests to look for other causes of pain, including:   MRI.   CT scan.   Bone scan.   Diagnostic injection: During a special x-ray (called fluoroscopy), a needle is put into the sacroiliac joint. A numbing medicine is injected into the joint. If the pain is improved or stopped, the diagnosis of sacroiliac joint dysfunction is more likely.  TREATMENT  There are a number of types of treatment used for sacroiliac joint dysfunction, including:  Only take over-the-counter or prescription medicines for pain, discomfort, or fever as directed by your caregiver.   Medications to relax muscles.   Rest. Decreasing activity can help cut down on painful muscle spasms and allow the back to heal.   Application of heat or ice to the lower back may improve muscle spasms and soothe pain.   Brace. A special back brace, called a sacroiliac belt, can help support the joint while your back is healing.   Physical therapy can help teach comfortable positions and exercises to strengthen muscles that support the sacroiliac joint.   Cortisone injections. Injections  of steroid medicine into the joint can help decrease swelling and improve pain.   Hyaluronic acid injections. This chemical improves lubrication within the sacroiliac joint, thereby decreasing pain.   Radiofrequency ablation. A special needle is placed into the joint, where it burns away nerves that are carrying pain messages from the joint.   Surgery. Because pain occurs during movement of the joint, screws and plates may be installed in order to limit or prevent joint motion.  HOME CARE INSTRUCTIONS   Take all medications exactly as directed.   Follow instructions regarding both rest and physical activity, to avoid worsening the pain.   Do physical therapy exercises exactly as prescribed.  SEEK IMMEDIATE MEDICAL CARE IF:  You experience increasingly severe pain.   You develop new symptoms, such as numbness or tingling in your legs or feet.   You lose bladder or bowel control.  Document Released: 11/19/2008 Document Revised: 08/12/2011 Document Reviewed: 11/19/2008 [employer] Patient Information 2012 [employer]. 

## 2012-03-07 NOTE — Progress Notes (Signed)
  PROCEDURE RECORD [facility]   Name: [name] DOB:09-Jun-1918 MRN: [id number]  Date:03/07/2012  Physician: Claudette Laws, MD    Nurse/CMA: Kelli Churn, CMA  Allergies:  Allergies  Allergen Reactions  . Metoclopramide Hcl     Consent Signed: yes  Is patient diabetic? no    Pregnant: no LMP: No LMP recorded. Patient is postmenopausal. (age 76-55)  Anticoagulants: no Anti-inflammatory: yes (tylenol arthritis) Antibiotics: no  Procedure: Sacrolilliac injection  Position: Prone Start Time:  11:57 End Time: 12:06 pm  Fluoro Time: 15  RN/CMA Chae Oommen, CMA Lareen Mullings, CMA    Time 11:30 am 12:10 pm    BP 180/60 182/66    Pulse 69 57    Respirations 14 14    O2 Sat 97 98    S/S 6 6    Pain Level 8/10 5/10     D/C home with [name]-Daughter, patient A & O X 3, D/C instructions reviewed, and sits independently.

## 2012-04-06 DIAGNOSIS — R011 Cardiac murmur, unspecified: Secondary | ICD-10-CM

## 2012-04-06 HISTORY — DX: Cardiac murmur, unspecified: R01.1

## 2012-04-27 ENCOUNTER — Ambulatory Visit (HOSPITAL_COMMUNITY)
Admission: RE | Admit: 2012-04-27 | Discharge: 2012-04-27 | Disposition: A | Discharge status: Home / Self Care | Payer: Medicare Other | Source: Ambulatory Visit | Attending: Cardiovascular Disease | Admitting: Cardiovascular Disease

## 2012-04-27 DIAGNOSIS — R011 Cardiac murmur, unspecified: Secondary | ICD-10-CM | POA: Insufficient documentation

## 2012-04-27 DIAGNOSIS — I10 Essential (primary) hypertension: Secondary | ICD-10-CM | POA: Insufficient documentation

## 2012-04-27 NOTE — Progress Notes (Signed)
*  PRELIMINARY RESULTS* Echocardiogram 2D Echocardiogram has been performed.  [name] 04/27/2012, 2:55 PM

## 2012-05-04 ENCOUNTER — Other Ambulatory Visit (HOSPITAL_COMMUNITY): Payer: Self-pay | Admitting: Family Medicine

## 2012-05-04 ENCOUNTER — Ambulatory Visit (HOSPITAL_COMMUNITY)
Admission: RE | Admit: 2012-05-04 | Discharge: 2012-05-04 | Disposition: A | Discharge status: Home / Self Care | Payer: Medicare Other | Source: Ambulatory Visit | Attending: Family Medicine | Admitting: Family Medicine

## 2012-05-04 DIAGNOSIS — M25569 Pain in unspecified knee: Secondary | ICD-10-CM

## 2012-06-08 ENCOUNTER — Ambulatory Visit: Payer: Medicare Other | Admitting: Physical Medicine & Rehabilitation

## 2012-10-03 ENCOUNTER — Encounter (INDEPENDENT_AMBULATORY_CARE_PROVIDER_SITE_OTHER): Payer: Self-pay | Admitting: Internal Medicine

## 2012-10-03 ENCOUNTER — Ambulatory Visit (INDEPENDENT_AMBULATORY_CARE_PROVIDER_SITE_OTHER): Payer: Medicare Other | Admitting: Internal Medicine

## 2012-10-03 VITALS — BP 130/62 | HR 64 | Temp 98.3°F | Ht 65.5 in | Wt 148.0 lb

## 2012-10-03 DIAGNOSIS — R197 Diarrhea, unspecified: Secondary | ICD-10-CM

## 2012-10-03 DIAGNOSIS — K589 Irritable bowel syndrome without diarrhea: Secondary | ICD-10-CM

## 2012-10-03 NOTE — Progress Notes (Signed)
Subjective:     Patient ID: [name], female   DOB: Jan 22, 1918, 79 y.o.   MRN: [id number]  HPIReferred to our office for mid abdominal pain x 2 weeks. Says it is getting worse.  She has been using Maalox x 1 week with some relief. She has also been using Pepto Bismol. She has seen black stools.  Her stools are loose.  For the past 2 weeks she has had loose stools. Caregiver says she has been having about 6 stools a day.  Caregiver says her stools have actually been loose since September. Loose stools usually after eating.  Stools have been watery. No fever. No recent antibiotics.   Last seen in 2010 by our office for constipation and hematochezia Her appetite has remained good.  This am she is not having any abdominal pain. H and H 12.8 and 39.2, MCV 89.1, platelet ct 541, AL)P 83, AST 27, ALT 23, Albumin 4.3,  Review of Systems see hpi Current Outpatient Prescriptions  Medication Sig Dispense Refill  . acetaminophen (TYLENOL) 650 MG CR tablet Take 650 mg by mouth every 8 (eight) hours as needed.      . ALPRAZolam (XANAX) 0.25 MG tablet Take 0.25 mg by mouth at bedtime as needed.      Marland Kitchen amLODipine (NORVASC) 10 MG tablet Take 10 mg by mouth daily.      Marland Kitchen amLODipine (NORVASC) 5 MG tablet Take 7.5 mg by mouth daily.      . calcium-vitamin D (OSCAL) 250-125 MG-UNIT per tablet Take 1 tablet by mouth daily.      Marland Kitchen escitalopram (LEXAPRO) 10 MG tablet Take 10 mg by mouth daily.      Marland Kitchen esomeprazole (NEXIUM) 40 MG capsule Take 40-80 mg by mouth daily.      . Melatonin 2.5 MG CAPS Take 5 mg by mouth.      . Multiple Vitamin (MULTIVITAMIN) tablet Take 1 tablet by mouth daily.      . vitamin B-12 (CYANOCOBALAMIN) 1000 MCG tablet Take 1,000 mcg by mouth 2 (two) times daily before a meal.      . zolpidem (AMBIEN) 10 MG tablet Take 10 mg by mouth at bedtime as needed.      Marland Kitchen atenolol (TENORMIN) 25 MG tablet Take 12.5 mg by mouth daily.      . ramipril (ALTACE) 5 MG capsule Take 5 mg by mouth  daily.      . simvastatin (ZOCOR) 10 MG tablet Take 10 mg by mouth daily.      . traMADol (ULTRAM) 50 MG tablet Take 50 mg by mouth 3 (three) times daily.       Past Medical History  Diagnosis Date  . Lumbar facet arthropathy   . Primary localized osteoarthrosis, hand   . Lumbosacral spondylosis without myelopathy   . Pain in joint, lower leg   . Cancer    Past Surgical History  Procedure Date  . Abdominal hysterectomy   . Spine surgery   . Breast surgery   . Appendectomy   . Cholecystectomy    Allergies  Allergen Reactions  . Metoclopramide Hcl         Objective:   Physical Exam Filed Vitals:   10/03/12 1050  BP: 130/62  Pulse: 64  Temp: 98.3 F (36.8 C)  Height: 5' 5.5" (1.664 m)  Weight: 148 lb (67.132 kg)   Alert and oriented. Skin warm and dry. Oral mucosa is moist.   . Sclera anicteric, conjunctivae is pink. Thyroid not  enlarged. No cervical lymphadenopathy. Lungs clear. Heart regular rate and rhythm.  Abdomen is soft. Bowel sounds are positive. No hepatomegaly. No abdominal masses felt. No tenderness.  No edema to lower extremities. Stool brown and guaiac negative today.      Assessment:    Diarrhea/ probably IBS.  Hx of constipation in the past.  No recent antibiotics.    Plan:    Stool studies. Imodium BID. OV in 2 weeks with stool diarrhea. Stool C and S, stool ova and para. C diff, lactoferrin.

## 2012-10-03 NOTE — Patient Instructions (Addendum)
Stool studies. Imodium twice a day.  OV in 2 weeks. Stool diary

## 2012-10-04 ENCOUNTER — Telehealth (INDEPENDENT_AMBULATORY_CARE_PROVIDER_SITE_OTHER): Payer: Self-pay | Admitting: *Deleted

## 2012-10-04 NOTE — Telephone Encounter (Signed)
Specimen must be separated from the urine

## 2012-10-04 NOTE — Telephone Encounter (Signed)
Harriett Sine called back and asked if Camelia Eng would please call her at [phone].

## 2012-10-04 NOTE — Telephone Encounter (Signed)
[name], caregiver, would like for [name] to return her call. [name] was to get stool samples without urine and they have not been successful at this. Every time [name] has urinated along with the stool. What else can be done? [name] will be with the patient until 3:00 pm today, 09/24/12, and will return tomorrow. The phone number is [phone].

## 2012-10-17 ENCOUNTER — Ambulatory Visit (INDEPENDENT_AMBULATORY_CARE_PROVIDER_SITE_OTHER): Payer: Medicare Other | Admitting: Internal Medicine

## 2012-10-17 ENCOUNTER — Encounter (INDEPENDENT_AMBULATORY_CARE_PROVIDER_SITE_OTHER): Payer: Self-pay | Admitting: Internal Medicine

## 2012-10-17 VITALS — BP 90/54 | HR 64 | Temp 98.2°F | Ht 65.0 in | Wt 147.3 lb

## 2012-10-17 DIAGNOSIS — K219 Gastro-esophageal reflux disease without esophagitis: Secondary | ICD-10-CM

## 2012-10-17 DIAGNOSIS — R197 Diarrhea, unspecified: Secondary | ICD-10-CM

## 2012-10-17 MED ORDER — DICYCLOMINE HCL 10 MG PO CAPS: 10.0000 mg | ORAL_CAPSULE | Freq: Two times a day (BID) | ORAL | Status: DC

## 2012-10-17 NOTE — Progress Notes (Signed)
Subjective:     Patient ID: [name], female   DOB: 1918-08-30, 79 y.o.   MRN: [id number]  HPIHere today for f/u of her diarrhea. She is taking Imodium BID which is not helping. Her caregiver says as soon as she eats she will have diarrhea. She is having 5 loose stools a day. Stools are foul smelling. No recent antibiotics. (Please note that caregiver is a poor historian). She was seen last week and ask to get stool samples. She does have abdominal pain with her BMs.  Appetite is good. No weight loss. No black stools.   Review of Systems see hpi Current Outpatient Prescriptions  Medication Sig Dispense Refill  . acetaminophen (TYLENOL) 650 MG CR tablet Take 650 mg by mouth every 8 (eight) hours as needed.      . ALPRAZolam (XANAX) 0.25 MG tablet Take 0.25 mg by mouth at bedtime as needed.      Marland Kitchen amLODipine (NORVASC) 10 MG tablet Take 10 mg by mouth daily.      Marland Kitchen amLODipine (NORVASC) 5 MG tablet Take 7.5 mg by mouth daily.      Marland Kitchen atenolol (TENORMIN) 25 MG tablet Take 12.5 mg by mouth daily.      . calcium-vitamin D (OSCAL) 250-125 MG-UNIT per tablet Take 1 tablet by mouth daily.      Marland Kitchen escitalopram (LEXAPRO) 10 MG tablet Take 10 mg by mouth daily.      Marland Kitchen esomeprazole (NEXIUM) 40 MG capsule Take 40-80 mg by mouth daily.      . Melatonin 2.5 MG CAPS Take 5 mg by mouth.      . Multiple Vitamin (MULTIVITAMIN) tablet Take 1 tablet by mouth daily.      . ramipril (ALTACE) 5 MG capsule Take 5 mg by mouth daily.      . simvastatin (ZOCOR) 10 MG tablet Take 10 mg by mouth daily.      . traMADol (ULTRAM) 50 MG tablet Take 50 mg by mouth 3 (three) times daily.      . vitamin B-12 (CYANOCOBALAMIN) 1000 MCG tablet Take 1,000 mcg by mouth 2 (two) times daily before a meal.      . zolpidem (AMBIEN) 10 MG tablet Take 10 mg by mouth at bedtime as needed.       No current facility-administered medications for this visit.   Past Medical History  Diagnosis Date  . Lumbar facet arthropathy   .  Primary localized osteoarthrosis, hand   . Lumbosacral spondylosis without myelopathy   . Pain in joint, lower leg    Past Surgical History  Procedure Laterality Date  . Abdominal hysterectomy    . Spine surgery    . Breast surgery    . Appendectomy    . Cholecystectomy     Allergies  Allergen Reactions  . Metoclopramide Hcl          Objective:   Physical Exam  Filed Vitals:   10/17/12 1422  BP: 126/50  Pulse: 64  Temp: 98.2 F (36.8 C)  Height: 5\' 5"  (1.651 m)  Weight: 147 lb 4.8 oz (66.815 kg)   Alert. Skin warm and dry. Oral mucosa is moist.   . Sclera anicteric, conjunctivae is pink. Thyroid not enlarged. No cervical lymphadenopathy. Lungs clear. Heart regular rate and rhythm.  Abdomen is soft. Bowel sounds are positive. No hepatomegaly. No abdominal masses felt. No tenderness.  No edema to lower extremities.       Assessment:     Diarrhea, An  infectious process needs to be ruled out.    Plan:    Stool studies. Rx for Dicyclomine 10mg  BID. Stop the Imodium. Further recommendations once we have the stool studies back. (Possible colonoscopy)

## 2012-10-17 NOTE — Patient Instructions (Addendum)
Dicyclomine 10mg  BID. Stop the Imodium. Stool diary.  OV in one month.

## 2012-10-22 LAB — STOOL CULTURE

## 2012-10-23 ENCOUNTER — Telehealth (INDEPENDENT_AMBULATORY_CARE_PROVIDER_SITE_OTHER): Payer: Self-pay | Admitting: Internal Medicine

## 2012-10-23 DIAGNOSIS — A09 Infectious gastroenteritis and colitis, unspecified: Secondary | ICD-10-CM

## 2012-10-23 MED ORDER — METRONIDAZOLE 500 MG PO TABS: 250.0000 mg | ORAL_TABLET | Freq: Three times a day (TID) | ORAL | Status: AC

## 2012-10-23 NOTE — Telephone Encounter (Signed)
Rx for Flagyl e-prescribed to her pharmacy

## 2012-10-31 ENCOUNTER — Telehealth (INDEPENDENT_AMBULATORY_CARE_PROVIDER_SITE_OTHER): Payer: Self-pay | Admitting: Internal Medicine

## 2012-11-01 NOTE — Telephone Encounter (Signed)
Opened in error

## 2012-11-20 ENCOUNTER — Encounter (INDEPENDENT_AMBULATORY_CARE_PROVIDER_SITE_OTHER): Payer: Self-pay | Admitting: Internal Medicine

## 2012-11-20 ENCOUNTER — Ambulatory Visit (INDEPENDENT_AMBULATORY_CARE_PROVIDER_SITE_OTHER): Payer: Medicare Other | Admitting: Internal Medicine

## 2012-11-20 VITALS — BP 124/70 | HR 68 | Temp 97.7°F | Resp 18 | Ht 65.0 in | Wt 141.4 lb

## 2012-11-20 DIAGNOSIS — R197 Diarrhea, unspecified: Secondary | ICD-10-CM

## 2012-11-20 DIAGNOSIS — R634 Abnormal weight loss: Secondary | ICD-10-CM

## 2012-11-20 LAB — CBC WITH DIFFERENTIAL/PLATELET
Eosinophils Relative: 1 % (ref 0–5)
HCT: 41 % (ref 36.0–46.0)
Lymphocytes Relative: 30 % (ref 12–46)
Lymphs Abs: 2.1 10*3/uL (ref 0.7–4.0)
MCV: 85.4 fL (ref 78.0–100.0)
Monocytes Absolute: 0.6 10*3/uL (ref 0.1–1.0)
Neutro Abs: 4.1 10*3/uL (ref 1.7–7.7)
Platelets: 511 10*3/uL — ABNORMAL HIGH (ref 150–400)
RBC: 4.8 MIL/uL (ref 3.87–5.11)
WBC: 6.9 10*3/uL (ref 4.0–10.5)

## 2012-11-20 LAB — BASIC METABOLIC PANEL
CO2: 24 mEq/L (ref 19–32)
Chloride: 106 mEq/L (ref 96–112)
Creat: 1.02 mg/dL (ref 0.50–1.10)
Potassium: 5.3 mEq/L (ref 3.5–5.3)

## 2012-11-20 MED ORDER — LOPERAMIDE HCL 2 MG PO CAPS: 2.0000 mg | ORAL_CAPSULE | Freq: Every day | ORAL | Status: DC

## 2012-11-20 NOTE — Progress Notes (Signed)
Presenting complaint;  Followup for diarrhea.  Subjective:  Patient is a 79 year old Caucasian female who is in for scheduled visit accompanied by her helper. Her diarrhea began about 6 months ago. She has been using Imodium on when necessary basis. She was seen in her office on 10/03/2012 and again on 10/17/2012. Rule studies were negative. Last month she was treated with metronidazole for a week. She states she is feeling better but diarrhea has not completely gone away. Patient has memory impairment and some of the history was provided by her helper. He complains of intermittent epigastric pain. She has poor appetite. She denies nausea vomiting melena rectal bleeding fever or chills. On her worst days she may have as many as 5-6 stools. Her daughter present and note whether or not she would benefit from digestive enzymes. She has been gradually losing weight. She has lost 6 pounds since her last visit 5 weeks ago. She reports no side effects with dicyclomine.  Current Medications: Current Outpatient Prescriptions  Medication Sig Dispense Refill  . acetaminophen (TYLENOL) 650 MG CR tablet Take 650 mg by mouth every 8 (eight) hours as needed.      . ALPRAZolam (XANAX) 0.25 MG tablet Take 0.25 mg by mouth daily after lunch.       Marland Kitchen amLODipine (NORVASC) 5 MG tablet Take 10 mg by mouth daily.       Marland Kitchen atenolol (TENORMIN) 25 MG tablet Take 12.5 mg by mouth daily.      . calcium-vitamin D (OSCAL) 250-125 MG-UNIT per tablet Take 1 tablet by mouth daily.      Marland Kitchen dicyclomine (BENTYL) 10 MG capsule Take 1 capsule (10 mg total) by mouth 2 (two) times daily before a meal.  60 capsule  2  . escitalopram (LEXAPRO) 10 MG tablet Take 10 mg by mouth daily.      Marland Kitchen esomeprazole (NEXIUM) 40 MG capsule Take 40-80 mg by mouth daily.      . Melatonin 2.5 MG CAPS Take 5 mg by mouth.      . ramipril (ALTACE) 5 MG capsule Take 5 mg by mouth daily.      . vitamin B-12 (CYANOCOBALAMIN) 1000 MCG tablet Take 1,000 mcg by  mouth 2 (two) times daily before a meal.       No current facility-administered medications for this visit.     Objective: Blood pressure 124/70, pulse 68, temperature 97.7 F (36.5 C), temperature source Oral, resp. rate 18, height 5\' 5"  (1.651 m), weight 141 lb 6.4 oz (64.139 kg). Patient is alert and in no acute distress. Conjunctiva is pink. Sclera is nonicteric Oropharyngeal mucosa is normal. No neck masses or thyromegaly noted. Abdomen is flat. Bowel sounds are normal. No bruits noted. Abdomen is soft with mild midepigastric tenderness. No LE edema or clubbing noted.  Labs/studies Results: Stool studies from February 2014. Fecal lactoferrin positive. C. difficile by PCR negative. Stool culture negative. O&P negative.  Assessment:  Patient's symptom complex consisting of diarrhea of 6 months duration epigastric pain and weight loss is worrisome. Stool studies are negative. Empiric therapy with metronidazole did not provide complete resolution of her diarrhea.   Plan: Discontinue dicyclomine as potential for side effects is high in this age group. CBC and metabolic 7. Abdominopelvic CT with contrast unless serum creatinine elevated. Imodium OTC 2 mg by mouth every morning after first bowel movement.

## 2012-11-20 NOTE — Patient Instructions (Addendum)
Take Imodium OTC 2 mg by mouth after first bowel movement daily. Physician will contact you or family member with results of blood work and further recommendations.

## 2012-11-21 ENCOUNTER — Other Ambulatory Visit (INDEPENDENT_AMBULATORY_CARE_PROVIDER_SITE_OTHER): Payer: Self-pay | Admitting: Internal Medicine

## 2012-11-21 DIAGNOSIS — R197 Diarrhea, unspecified: Secondary | ICD-10-CM

## 2012-11-21 DIAGNOSIS — G8929 Other chronic pain: Secondary | ICD-10-CM

## 2012-11-21 DIAGNOSIS — R634 Abnormal weight loss: Secondary | ICD-10-CM

## 2012-11-24 ENCOUNTER — Other Ambulatory Visit (INDEPENDENT_AMBULATORY_CARE_PROVIDER_SITE_OTHER): Payer: Self-pay | Admitting: Internal Medicine

## 2012-12-01 ENCOUNTER — Encounter (HOSPITAL_COMMUNITY): Payer: Self-pay

## 2012-12-01 ENCOUNTER — Other Ambulatory Visit (INDEPENDENT_AMBULATORY_CARE_PROVIDER_SITE_OTHER): Payer: Self-pay | Admitting: Internal Medicine

## 2012-12-01 ENCOUNTER — Ambulatory Visit (HOSPITAL_COMMUNITY)
Admission: RE | Admit: 2012-12-01 | Discharge: 2012-12-01 | Disposition: A | Discharge status: Home / Self Care | Payer: Medicare Other | Source: Ambulatory Visit | Attending: Internal Medicine | Admitting: Internal Medicine

## 2012-12-01 DIAGNOSIS — R634 Abnormal weight loss: Secondary | ICD-10-CM | POA: Insufficient documentation

## 2012-12-01 DIAGNOSIS — N289 Disorder of kidney and ureter, unspecified: Secondary | ICD-10-CM | POA: Insufficient documentation

## 2012-12-01 DIAGNOSIS — R197 Diarrhea, unspecified: Secondary | ICD-10-CM

## 2012-12-01 DIAGNOSIS — N281 Cyst of kidney, acquired: Secondary | ICD-10-CM | POA: Insufficient documentation

## 2012-12-01 DIAGNOSIS — R1013 Epigastric pain: Secondary | ICD-10-CM | POA: Insufficient documentation

## 2012-12-01 MED ORDER — IOHEXOL 300 MG/ML  SOLN: 100.0000 mL | Freq: Once | INTRAMUSCULAR | Status: AC | PRN

## 2012-12-05 ENCOUNTER — Encounter: Payer: Self-pay | Admitting: *Deleted

## 2013-01-01 ENCOUNTER — Encounter (INDEPENDENT_AMBULATORY_CARE_PROVIDER_SITE_OTHER): Payer: Self-pay | Admitting: Internal Medicine

## 2013-01-01 ENCOUNTER — Ambulatory Visit (INDEPENDENT_AMBULATORY_CARE_PROVIDER_SITE_OTHER): Payer: Medicare Other | Admitting: Internal Medicine

## 2013-01-01 VITALS — BP 126/52 | HR 54 | Ht 65.5 in | Wt 144.5 lb

## 2013-01-01 DIAGNOSIS — R197 Diarrhea, unspecified: Secondary | ICD-10-CM

## 2013-01-01 NOTE — Patient Instructions (Addendum)
Imodium one after 1st loose stool and then one in the PM

## 2013-01-01 NOTE — Progress Notes (Signed)
Subjective:     Patient ID: [name], female   DOB: 1917-12-25, 79 y.o.   MRN: [id number]  HPI  Dicyclomine d/c by Dr. Karilyn Cota.  She does occasionally have stomach cramps.  She has some loose stools.  A week ago she fell, there were no fractures. She did pass a small amount of blood.  Appetite is good. She has gained 3 pounds since her last visit last month. She usually has 2-4 times a day and some days she may just have one BM. Labs/studies Results:  Stool studies from February 2014.  Fecal lactoferrin positive.  C. difficile by PCR negative.  Stool culture negative.  O&P negative.  11/2012 CT abdomen/pelvis with CM: 1. No acute findings in the abdomen pelvis.  2. Cystic lesion within the tail the pancreas. Recommend MRI with  and without contrast further evaluation.  3. Bilateral simple renal cyst. One cyst in the upper pole of the  right kidney cannot be fully characterized. Recommend evaluation  with the abdominal MRI recommended for pancreatic lesion.  4. Post cholecystectomy.  5. Hiatal hernia.    Review of Systems see hpi Current Outpatient Prescriptions  Medication Sig Dispense Refill  . acetaminophen (TYLENOL) 650 MG CR tablet Take 650 mg by mouth every 8 (eight) hours as needed.      . ALPRAZolam (XANAX) 0.25 MG tablet Take 0.25 mg by mouth daily after lunch.       Marland Kitchen amLODipine (NORVASC) 5 MG tablet Take 10 mg by mouth daily.       Marland Kitchen atenolol (TENORMIN) 25 MG tablet Take 12.5 mg by mouth daily.      . calcium-vitamin D (OSCAL) 250-125 MG-UNIT per tablet Take 1 tablet by mouth daily.      Marland Kitchen escitalopram (LEXAPRO) 10 MG tablet Take 10 mg by mouth daily.      Marland Kitchen esomeprazole (NEXIUM) 40 MG capsule Take 40 mg by mouth daily.       Marland Kitchen loperamide (IMODIUM) 2 MG capsule Take 1 capsule (2 mg total) by mouth daily.  30 capsule  0  . Melatonin 2.5 MG CAPS Take 5 mg by mouth.      . ramipril (ALTACE) 5 MG capsule Take 5 mg by mouth daily.      . vitamin B-12  (CYANOCOBALAMIN) 1000 MCG tablet Take 1,000 mcg by mouth 2 (two) times daily before a meal.       No current facility-administered medications for this visit.   Past Surgical History  Procedure Laterality Date  . Abdominal hysterectomy    . Spine surgery    . Breast surgery    . Appendectomy  50 yrs ago  . Cholecystectomy    . Hiatal hernia repair    . Tonsillectomy  88yrs ago   Allergies  Allergen Reactions  . Metoclopramide Hcl    Past Medical History  Diagnosis Date  . Lumbar facet arthropathy   . Primary localized osteoarthrosis, hand   . Lumbosacral spondylosis without myelopathy   . Pain in joint, lower leg   . Hypertension   . Hyperlipidemia   . Palpitation   . Aortic valve stenosis   . Murmur 04/2012    2D Echo EF 65-70%, she did have moderate aortic stenosis with a mean gradient of 15, maximum gradient 21, and calculated aortic valve area of 1.05 cm2 to 1.1 cm2, she did have moderate tricuspid regurgitation and moderate pulmonary hypentension with a PA pressure of 55mm, there was trace of mild MR.  \  Allergies  Allergen Reactions  . Metoclopramide Hcl         Objective:   Physical Exam  Filed Vitals:   01/01/13 1532  BP: 126/52  Pulse: 54  Height: 5' 5.5" (1.664 m)  Weight: 144 lb 8 oz (65.545 kg)    Alert and oriented. Skin warm and dry. Oral mucosa is moist.   . Sclera anicteric, conjunctivae is pink. Thyroid not enlarged. No cervical lymphadenopathy. Lungs clear. Heart regular rate and rhythm.  Abdomen is soft. Bowel sounds are positive. No hepatomegaly. No abdominal masses felt. No tenderness.  No edema to lower extremities.      Assessment:     Diarrhea. with some abdominal discomfort.   There is no diarrhea at this time. Plan:    OV in 2 months. Imodium on a prn basis.

## 2013-01-02 ENCOUNTER — Encounter (INDEPENDENT_AMBULATORY_CARE_PROVIDER_SITE_OTHER): Payer: Self-pay

## 2013-02-13 ENCOUNTER — Other Ambulatory Visit: Payer: Self-pay

## 2013-02-13 MED ORDER — AMLODIPINE BESYLATE 5 MG PO TABS: 10.0000 mg | ORAL_TABLET | Freq: Every day | ORAL | Status: DC

## 2013-02-13 NOTE — Telephone Encounter (Signed)
Rx was sent to pharmacy electronically. 

## 2013-04-02 ENCOUNTER — Ambulatory Visit (INDEPENDENT_AMBULATORY_CARE_PROVIDER_SITE_OTHER): Payer: Medicare Other | Admitting: Internal Medicine

## 2013-04-02 ENCOUNTER — Encounter (INDEPENDENT_AMBULATORY_CARE_PROVIDER_SITE_OTHER): Payer: Self-pay | Admitting: Internal Medicine

## 2013-04-02 VITALS — BP 112/40 | HR 64 | Temp 98.1°F | Ht 65.5 in | Wt <= 1120 oz

## 2013-04-02 DIAGNOSIS — R932 Abnormal findings on diagnostic imaging of liver and biliary tract: Secondary | ICD-10-CM

## 2013-04-02 DIAGNOSIS — K589 Irritable bowel syndrome without diarrhea: Secondary | ICD-10-CM

## 2013-04-02 NOTE — Progress Notes (Signed)
Subjective:     Patient ID: [name], female   DOB: 02/20/1918, 79 y.o.   MRN: [id number]  HPI  Here today for f/u of herdiarrhea. She tells me she is not having any diarrhea at this time.  Her stools are formed.  She usually has BMs x 3. Usually has a BM right after eating. Her appetite is good. No weight loss. Weight has remained the same. No melena or bright red rectal bleeding. Her stools studies were negative in the past When she had diarrhea, she was empirically treated with Flagyl but this did not help. She has taken Imodium on a prn basis.  CT in March revealed a pancreatitic cyst.    Review of Systems See hpi  Current Outpatient Prescriptions  Medication Sig Dispense Refill  . acetaminophen (TYLENOL) 650 MG CR tablet Take 650 mg by mouth every 8 (eight) hours as needed.      . ALPRAZolam (XANAX) 0.25 MG tablet Take 0.25 mg by mouth daily after lunch.       Marland Kitchen amLODipine (NORVASC) 5 MG tablet Take 2 tablets (10 mg total) by mouth daily.  30 tablet  6  . atenolol (TENORMIN) 25 MG tablet Take 12.5 mg by mouth daily.      . calcium-vitamin D (OSCAL) 250-125 MG-UNIT per tablet Take 1 tablet by mouth daily.      Marland Kitchen escitalopram (LEXAPRO) 10 MG tablet Take 10 mg by mouth daily.      Marland Kitchen esomeprazole (NEXIUM) 40 MG capsule Take 40 mg by mouth daily.       Marland Kitchen loperamide (IMODIUM) 2 MG capsule Take 1 capsule (2 mg total) by mouth daily.  30 capsule  0  . Melatonin 2.5 MG CAPS Take 5 mg by mouth.      . ramipril (ALTACE) 5 MG capsule Take 5 mg by mouth daily.      . vitamin B-12 (CYANOCOBALAMIN) 1000 MCG tablet Take 1,000 mcg by mouth 2 (two) times daily before a meal.       No current facility-administered medications for this visit.   Past Medical History  Diagnosis Date  . Lumbar facet arthropathy   . Primary localized osteoarthrosis, hand   . Lumbosacral spondylosis without myelopathy   . Pain in joint, lower leg   . Hypertension   . Hyperlipidemia   . Palpitation   .  Aortic valve stenosis   . Murmur 04/2012    2D Echo EF 65-70%, she did have moderate aortic stenosis with a mean gradient of 15, maximum gradient 21, and calculated aortic valve area of 1.05 cm2 to 1.1 cm2, she did have moderate tricuspid regurgitation and moderate pulmonary hypentension with a PA pressure of 55mm, there was trace of mild MR.   Past Surgical History  Procedure Laterality Date  . Abdominal hysterectomy    . Spine surgery    . Breast surgery    . Appendectomy  50 yrs ago  . Cholecystectomy    . Hiatal hernia repair    . Tonsillectomy  22yrs ago   Allergies  Allergen Reactions  . Metoclopramide Hcl        Objective:   Physical Exam  Filed Vitals:   04/02/13 1557  BP: 112/40  Pulse: 64  Temp: 98.1 F (36.7 C)  Height: 5' 5.5" (1.664 m)  Weight: 65 lb 8 oz (29.711 kg)  Alert and oriented. Skin warm and dry. Oral mucosa is moist.   . Sclera anicteric, conjunctivae is pink. Thyroid not  enlarged. No cervical lymphadenopathy. Lungs clear. Heart regular rate and rhythm.  Abdomen is soft. Bowel sounds are positive. No hepatomegaly. No abdominal masses felt. No tenderness.  No edema to lower extremities.        Assessment:    Probably IBS. She is having 3 stools a day after meals. Stools ar formed and normal.  She is maintaining her weight. Her appetite is good. She takes Imodium only on a "prn basis". Hx of pancreatitic cyst on CT. In March.    Plan:    OV in 6 months. Repeat CT in September to compare pancreatitic cyst that was found in March of this year.

## 2013-04-02 NOTE — Patient Instructions (Addendum)
Ct abdomen/pelvis with CM in September. OV 6 months

## 2013-04-30 ENCOUNTER — Encounter (INDEPENDENT_AMBULATORY_CARE_PROVIDER_SITE_OTHER): Payer: Self-pay | Admitting: *Deleted

## 2013-06-04 ENCOUNTER — Other Ambulatory Visit (HOSPITAL_COMMUNITY): Payer: Self-pay | Admitting: Family Medicine

## 2013-06-04 ENCOUNTER — Ambulatory Visit (HOSPITAL_COMMUNITY)
Admission: RE | Admit: 2013-06-04 | Discharge: 2013-06-04 | Disposition: A | Discharge status: Home / Self Care | Payer: Medicare Other | Source: Ambulatory Visit | Attending: Family Medicine | Admitting: Family Medicine

## 2013-06-04 DIAGNOSIS — M19079 Primary osteoarthritis, unspecified ankle and foot: Secondary | ICD-10-CM | POA: Insufficient documentation

## 2013-06-04 DIAGNOSIS — M79609 Pain in unspecified limb: Secondary | ICD-10-CM | POA: Insufficient documentation

## 2013-10-02 ENCOUNTER — Encounter (INDEPENDENT_AMBULATORY_CARE_PROVIDER_SITE_OTHER): Payer: Self-pay | Admitting: *Deleted

## 2013-10-08 ENCOUNTER — Other Ambulatory Visit: Payer: Self-pay | Admitting: Cardiovascular Disease

## 2013-10-08 NOTE — Telephone Encounter (Signed)
Rx was sent to pharmacy electronically. 

## 2013-12-18 ENCOUNTER — Ambulatory Visit (INDEPENDENT_AMBULATORY_CARE_PROVIDER_SITE_OTHER): Payer: Medicare Other | Admitting: Internal Medicine

## 2013-12-18 ENCOUNTER — Encounter (INDEPENDENT_AMBULATORY_CARE_PROVIDER_SITE_OTHER): Payer: Self-pay | Admitting: Internal Medicine

## 2013-12-18 VITALS — BP 130/80 | HR 68 | Temp 97.6°F | Resp 18 | Ht 65.5 in | Wt 139.9 lb

## 2013-12-18 DIAGNOSIS — R197 Diarrhea, unspecified: Secondary | ICD-10-CM

## 2013-12-18 DIAGNOSIS — R634 Abnormal weight loss: Secondary | ICD-10-CM

## 2013-12-18 DIAGNOSIS — K219 Gastro-esophageal reflux disease without esophagitis: Secondary | ICD-10-CM

## 2013-12-18 MED ORDER — ACETAMINOPHEN ER 650 MG PO TBCR: 650.0000 mg | EXTENDED_RELEASE_TABLET | Freq: Three times a day (TID) | ORAL | Status: DC | PRN

## 2013-12-18 NOTE — Patient Instructions (Addendum)
Continue taking Imodium on as-needed basis. Call if Imodium does not work. Tylenol dose not to exceed three tablets a day.

## 2013-12-18 NOTE — Progress Notes (Signed)
Presenting complaint;  Follow for diarrhea and GERD.  Subjective:  Patient is a 80 year old Caucasian female who is here for scheduled visit accompanied by her daughter [name]. She was last seen in July 2014. She has had intermittent diarrhea for more than one year. Last her she had stool studies which were all negative. She has been using Imodium on an as-needed basis. Patient states she is doing better as she is having less episodes of diarrhea. She has diarrhea and no more than one or 2 days a week. He takes Imodium and it controls her diarrhea. She has not become constipated with Imodium. She denies melena or rectal bleeding she does have intermittent lower abdominal pain relieved with defecation. This pain occurs only when she has diarrhea. She denies nocturnal diarrhea. She states she has a good appetite but she has lost 6 pounds since her last visit. She says heartburn is well controlled with therapy. She denies dysphagia. [name] said she had blood work recently and told to have mild anemia and begun on ferrous sulfate.   Current Medications: Outpatient Encounter Prescriptions as of 12/18/2013  Medication Sig  . acetaminophen (TYLENOL) 650 MG CR tablet Take 650 mg by mouth every 8 (eight) hours as needed.  . ALPRAZolam (XANAX) 0.25 MG tablet Take 0.25 mg by mouth daily after lunch.   Marland Kitchen amLODipine (NORVASC) 10 MG tablet TAKE 1 TABLET BY MOUTH DAILY.  . calcium-vitamin D (OSCAL) 250-125 MG-UNIT per tablet Take 1 tablet by mouth daily.  Marland Kitchen donepezil (ARICEPT) 10 MG tablet Take 10 mg by mouth at bedtime.   Marland Kitchen escitalopram (LEXAPRO) 10 MG tablet Take 20 mg by mouth daily.   Marland Kitchen esomeprazole (NEXIUM) 20 MG capsule Take 20 mg by mouth daily at 12 noon.  . ferrous sulfate dried (SLOW FE) 160 (50 FE) MG TBCR SR tablet Take 160 mg by mouth daily.  Marland Kitchen loperamide (IMODIUM) 2 MG capsule Take 1 capsule (2 mg total) by mouth daily.  . Melatonin 2.5 MG CAPS Take 10 mg by mouth at bedtime.   Marland Kitchen OVER THE  COUNTER MEDICATION Sleep Aid 50 mg -patient takes daily at bedtime.  . Probiotic Product (PROBIOTIC DAILY PO) Take by mouth daily.  . ramipril (ALTACE) 5 MG capsule Take 5 mg by mouth daily.  . vitamin B-12 (CYANOCOBALAMIN) 1000 MCG tablet Take 1,000 mcg by mouth daily.   . [DISCONTINUED] esomeprazole (NEXIUM) 40 MG capsule Take 40 mg by mouth daily.   . [DISCONTINUED] atenolol (TENORMIN) 25 MG tablet Take 12.5 mg by mouth daily.     Objective: Blood pressure 130/80, pulse 68, temperature 97.6 F (36.4 C), temperature source Oral, resp. rate 18, height 5' 5.5" (1.664 m), weight 139 lb 14.4 oz (63.458 kg). Patient is alert and in no acute distress. She has mild hearing impairment. Conjunctiva is pink. Sclera is nonicteric Oropharyngeal mucosa is normal. No neck masses or thyromegaly noted. Cardiac exam with regular rhythm normal S1 and S2.  She has grade 2/6 systolic ejection murmur best heard at LLSB and aortic area. Lungs are clear to auscultation. Abdomen is flat. Bowel sounds are normal. On palpation abdomen is soft and nontender without organomegaly or masses.  No LE edema or clubbing noted.  Labs/studies Results: Lab data from 11/26/2013. WBC 9.1 H&H 11.4 and 34.9 and MCV 81.7. Platelet count 361K. Comprehensive chemistry panel is normal except a BUN of 27 and creatinine of 1.28. Albumin is 3.6 and serum calcium 8.7.  Assessment:  #1. chronic/intermittent diarrhea. Stool studies been  negative. Diarrhea appears to be typical of IBS she's not a candidate for anti-spasmodic therapy. Would not recommend further workup unless diarrhea gets worse and Imodium does not work. #2. GERD. Heartburn is well controlled at single dose of PPI. In the past she has required double dose PPI. #3. Weight loss. Doubt that weight loss is secondary to her diarrhea. She does not have constitutional symptoms.  Plan:  Patient to continue to use Imodium on an as-needed basis. She will call if diarrhea  gets worse and or Imodium does not work. Continue Nexium at current dose. Office visit in 6 months.

## 2014-02-01 ENCOUNTER — Other Ambulatory Visit (HOSPITAL_COMMUNITY): Payer: Self-pay | Admitting: Family Medicine

## 2014-02-01 ENCOUNTER — Ambulatory Visit (HOSPITAL_COMMUNITY)
Admission: RE | Admit: 2014-02-01 | Discharge: 2014-02-01 | Disposition: A | Discharge status: Home / Self Care | Payer: Medicare Other | Source: Ambulatory Visit | Attending: Family Medicine | Admitting: Family Medicine

## 2014-02-01 DIAGNOSIS — R221 Localized swelling, mass and lump, neck: Principal | ICD-10-CM

## 2014-02-01 DIAGNOSIS — K119 Disease of salivary gland, unspecified: Secondary | ICD-10-CM

## 2014-02-01 DIAGNOSIS — R220 Localized swelling, mass and lump, head: Secondary | ICD-10-CM | POA: Insufficient documentation

## 2014-02-04 ENCOUNTER — Ambulatory Visit (HOSPITAL_COMMUNITY): Payer: Medicare Other

## 2014-02-11 ENCOUNTER — Other Ambulatory Visit (HOSPITAL_COMMUNITY): Payer: Self-pay | Admitting: Family Medicine

## 2014-02-11 DIAGNOSIS — E041 Nontoxic single thyroid nodule: Secondary | ICD-10-CM

## 2014-02-13 ENCOUNTER — Ambulatory Visit (HOSPITAL_COMMUNITY): Payer: Medicare Other

## 2014-02-14 ENCOUNTER — Other Ambulatory Visit (HOSPITAL_COMMUNITY): Payer: Medicare Other

## 2014-02-14 ENCOUNTER — Ambulatory Visit (INDEPENDENT_AMBULATORY_CARE_PROVIDER_SITE_OTHER): Payer: Medicare Other | Admitting: Otolaryngology

## 2014-02-14 DIAGNOSIS — D449 Neoplasm of uncertain behavior of unspecified endocrine gland: Secondary | ICD-10-CM

## 2014-03-07 ENCOUNTER — Ambulatory Visit (INDEPENDENT_AMBULATORY_CARE_PROVIDER_SITE_OTHER): Payer: Medicare Other | Admitting: Otolaryngology

## 2014-03-07 DIAGNOSIS — D37039 Neoplasm of uncertain behavior of the major salivary glands, unspecified: Secondary | ICD-10-CM

## 2014-03-13 ENCOUNTER — Emergency Department (HOSPITAL_COMMUNITY): Payer: Medicare Other

## 2014-03-13 ENCOUNTER — Encounter (HOSPITAL_COMMUNITY): Payer: Self-pay | Admitting: Emergency Medicine

## 2014-03-13 ENCOUNTER — Inpatient Hospital Stay (HOSPITAL_COMMUNITY)
Admission: EM | Admit: 2014-03-13 | Discharge: 2014-03-27 | DRG: 682 | Disposition: A | Discharge status: Hospice | Payer: Medicare Other | Attending: Internal Medicine | Admitting: Internal Medicine

## 2014-03-13 DIAGNOSIS — Z66 Do not resuscitate: Secondary | ICD-10-CM | POA: Diagnosis present

## 2014-03-13 DIAGNOSIS — K219 Gastro-esophageal reflux disease without esophagitis: Secondary | ICD-10-CM | POA: Diagnosis present

## 2014-03-13 DIAGNOSIS — E860 Dehydration: Secondary | ICD-10-CM | POA: Diagnosis present

## 2014-03-13 DIAGNOSIS — R229 Localized swelling, mass and lump, unspecified: Secondary | ICD-10-CM | POA: Diagnosis present

## 2014-03-13 DIAGNOSIS — R932 Abnormal findings on diagnostic imaging of liver and biliary tract: Secondary | ICD-10-CM

## 2014-03-13 DIAGNOSIS — G9341 Metabolic encephalopathy: Secondary | ICD-10-CM | POA: Diagnosis present

## 2014-03-13 DIAGNOSIS — Z9089 Acquired absence of other organs: Secondary | ICD-10-CM | POA: Diagnosis not present

## 2014-03-13 DIAGNOSIS — I10 Essential (primary) hypertension: Secondary | ICD-10-CM | POA: Diagnosis present

## 2014-03-13 DIAGNOSIS — Z853 Personal history of malignant neoplasm of breast: Secondary | ICD-10-CM

## 2014-03-13 DIAGNOSIS — E669 Obesity, unspecified: Secondary | ICD-10-CM | POA: Diagnosis present

## 2014-03-13 DIAGNOSIS — F0390 Unspecified dementia without behavioral disturbance: Secondary | ICD-10-CM | POA: Diagnosis present

## 2014-03-13 DIAGNOSIS — R5383 Other fatigue: Secondary | ICD-10-CM | POA: Diagnosis not present

## 2014-03-13 DIAGNOSIS — R197 Diarrhea, unspecified: Secondary | ICD-10-CM | POA: Diagnosis present

## 2014-03-13 DIAGNOSIS — M19049 Primary osteoarthritis, unspecified hand: Secondary | ICD-10-CM | POA: Diagnosis present

## 2014-03-13 DIAGNOSIS — E872 Acidosis, unspecified: Secondary | ICD-10-CM | POA: Diagnosis present

## 2014-03-13 DIAGNOSIS — E875 Hyperkalemia: Secondary | ICD-10-CM | POA: Diagnosis present

## 2014-03-13 DIAGNOSIS — Z823 Family history of stroke: Secondary | ICD-10-CM

## 2014-03-13 DIAGNOSIS — E876 Hypokalemia: Secondary | ICD-10-CM | POA: Diagnosis not present

## 2014-03-13 DIAGNOSIS — R5381 Other malaise: Secondary | ICD-10-CM | POA: Diagnosis present

## 2014-03-13 DIAGNOSIS — H9190 Unspecified hearing loss, unspecified ear: Secondary | ICD-10-CM | POA: Diagnosis present

## 2014-03-13 DIAGNOSIS — E871 Hypo-osmolality and hyponatremia: Secondary | ICD-10-CM | POA: Diagnosis present

## 2014-03-13 DIAGNOSIS — N179 Acute kidney failure, unspecified: Secondary | ICD-10-CM | POA: Diagnosis present

## 2014-03-13 DIAGNOSIS — I359 Nonrheumatic aortic valve disorder, unspecified: Secondary | ICD-10-CM | POA: Diagnosis present

## 2014-03-13 DIAGNOSIS — M533 Sacrococcygeal disorders, not elsewhere classified: Secondary | ICD-10-CM

## 2014-03-13 DIAGNOSIS — K589 Irritable bowel syndrome without diarrhea: Secondary | ICD-10-CM

## 2014-03-13 DIAGNOSIS — R7401 Elevation of levels of liver transaminase levels: Secondary | ICD-10-CM | POA: Diagnosis present

## 2014-03-13 DIAGNOSIS — S52599A Other fractures of lower end of unspecified radius, initial encounter for closed fracture: Secondary | ICD-10-CM

## 2014-03-13 DIAGNOSIS — S42209A Unspecified fracture of upper end of unspecified humerus, initial encounter for closed fracture: Secondary | ICD-10-CM

## 2014-03-13 DIAGNOSIS — M76899 Other specified enthesopathies of unspecified lower limb, excluding foot: Secondary | ICD-10-CM

## 2014-03-13 DIAGNOSIS — G9340 Encephalopathy, unspecified: Secondary | ICD-10-CM | POA: Diagnosis present

## 2014-03-13 DIAGNOSIS — E785 Hyperlipidemia, unspecified: Secondary | ICD-10-CM | POA: Diagnosis present

## 2014-03-13 DIAGNOSIS — D649 Anemia, unspecified: Secondary | ICD-10-CM | POA: Diagnosis present

## 2014-03-13 DIAGNOSIS — R7402 Elevation of levels of lactic acid dehydrogenase (LDH): Secondary | ICD-10-CM | POA: Diagnosis present

## 2014-03-13 DIAGNOSIS — R740 Nonspecific elevation of levels of transaminase and lactic acid dehydrogenase [LDH]: Secondary | ICD-10-CM

## 2014-03-13 DIAGNOSIS — R930 Abnormal findings on diagnostic imaging of skull and head, not elsewhere classified: Secondary | ICD-10-CM

## 2014-03-13 DIAGNOSIS — R9089 Other abnormal findings on diagnostic imaging of central nervous system: Secondary | ICD-10-CM | POA: Diagnosis present

## 2014-03-13 DIAGNOSIS — Z79899 Other long term (current) drug therapy: Secondary | ICD-10-CM

## 2014-03-13 DIAGNOSIS — D72829 Elevated white blood cell count, unspecified: Secondary | ICD-10-CM | POA: Diagnosis present

## 2014-03-13 HISTORY — DX: Malignant neoplasm of unspecified site of unspecified female breast: C50.919

## 2014-03-13 LAB — MRSA PCR SCREENING: MRSA by PCR: NEGATIVE

## 2014-03-13 LAB — URINALYSIS, ROUTINE W REFLEX MICROSCOPIC
BILIRUBIN URINE: NEGATIVE
Glucose, UA: NEGATIVE mg/dL
Hgb urine dipstick: NEGATIVE
Ketones, ur: NEGATIVE mg/dL
LEUKOCYTES UA: NEGATIVE
NITRITE: NEGATIVE
PH: 5 (ref 5.0–8.0)
Protein, ur: NEGATIVE mg/dL
SPECIFIC GRAVITY, URINE: 1.025 (ref 1.005–1.030)
UROBILINOGEN UA: 0.2 mg/dL (ref 0.0–1.0)

## 2014-03-13 LAB — BASIC METABOLIC PANEL
ANION GAP: 12 (ref 5–15)
ANION GAP: 16 — AB (ref 5–15)
BUN: 53 mg/dL — ABNORMAL HIGH (ref 6–23)
BUN: 51 mg/dL — ABNORMAL HIGH (ref 6–23)
CALCIUM: 12.2 mg/dL — AB (ref 8.4–10.5)
CALCIUM: 12.3 mg/dL — AB (ref 8.4–10.5)
CO2: 21 meq/L (ref 19–32)
CO2: 19 mEq/L (ref 19–32)
CREATININE: 1.99 mg/dL — AB (ref 0.50–1.10)
CREATININE: 1.77 mg/dL — AB (ref 0.50–1.10)
Chloride: 102 mEq/L (ref 96–112)
Chloride: 101 mEq/L (ref 96–112)
GFR calc Af Amer: 27 mL/min — ABNORMAL LOW (ref >90)
GFR calc non Af Amer: 23 mL/min — ABNORMAL LOW (ref >90)
GFR, EST AFRICAN AMERICAN: 24 mL/min — AB (ref >90)
GFR, EST NON AFRICAN AMERICAN: 20 mL/min — AB (ref >90)
Glucose, Bld: 96 mg/dL (ref 70–99)
Glucose, Bld: 104 mg/dL — ABNORMAL HIGH (ref 70–99)
Potassium: 6 mEq/L — ABNORMAL HIGH (ref 3.7–5.3)
Potassium: 5.2 mEq/L (ref 3.7–5.3)
SODIUM: 135 meq/L — AB (ref 137–147)
SODIUM: 136 meq/L — AB (ref 137–147)

## 2014-03-13 LAB — TROPONIN I: Troponin I: <0.3 ng/mL (ref <0.30)

## 2014-03-13 LAB — CBC
HCT: 31 % — ABNORMAL LOW (ref 36.0–46.0)
Hemoglobin: 10.6 g/dL — ABNORMAL LOW (ref 12.0–15.0)
MCH: 27.9 pg (ref 26.0–34.0)
MCHC: 34.2 g/dL (ref 30.0–36.0)
MCV: 81.6 fL (ref 78.0–100.0)
PLATELETS: 202 10*3/uL (ref 150–400)
RBC: 3.8 MIL/uL — AB (ref 3.87–5.11)
RDW: 19.3 % — AB (ref 11.5–15.5)
WBC: 7.3 10*3/uL (ref 4.0–10.5)

## 2014-03-13 LAB — PRO B NATRIURETIC PEPTIDE: PRO B NATRI PEPTIDE: 1434 pg/mL — AB (ref 0–450)

## 2014-03-13 MED ORDER — ONDANSETRON HCL 4 MG/2ML IJ SOLN: 4.0000 mg | Freq: Four times a day (QID) | INTRAMUSCULAR | Status: DC | PRN

## 2014-03-13 MED ORDER — SODIUM POLYSTYRENE SULFONATE 15 GM/60ML PO SUSP
15.0000 g | Freq: Once | ORAL | Status: AC
  Administered 2014-03-13: 15 g via ORAL
  Filled 2014-03-13: qty 60

## 2014-03-13 MED ORDER — ACETAMINOPHEN 325 MG PO TABS
650.0000 mg | ORAL_TABLET | Freq: Four times a day (QID) | ORAL | Status: DC | PRN
  Administered 2014-03-13 – 2014-03-18 (×3): 650 mg via ORAL
  Filled 2014-03-13 (×3): qty 2

## 2014-03-13 MED ORDER — ONDANSETRON HCL 4 MG PO TABS: 4.0000 mg | ORAL_TABLET | Freq: Four times a day (QID) | ORAL | Status: DC | PRN

## 2014-03-13 MED ORDER — ACETAMINOPHEN 650 MG RE SUPP
650.0000 mg | Freq: Four times a day (QID) | RECTAL | Status: DC | PRN
  Administered 2014-03-25: 650 mg via RECTAL
  Filled 2014-03-13: qty 1

## 2014-03-13 MED ORDER — HYDROCODONE-ACETAMINOPHEN 5-325 MG PO TABS: 1.0000 | ORAL_TABLET | ORAL | Status: DC | PRN

## 2014-03-13 MED ORDER — ALPRAZOLAM 0.25 MG PO TABS
0.2500 mg | ORAL_TABLET | Freq: Once | ORAL | Status: AC
  Administered 2014-03-13: 0.25 mg via ORAL
  Filled 2014-03-13: qty 1

## 2014-03-13 MED ORDER — ENOXAPARIN SODIUM 30 MG/0.3ML ~~LOC~~ SOLN
30.0000 mg | SUBCUTANEOUS | Status: DC
Start: 2014-03-13 — End: 2014-03-18
  Administered 2014-03-13 – 2014-03-17 (×5): 30 mg via SUBCUTANEOUS
  Filled 2014-03-13 (×6): qty 0.3

## 2014-03-13 MED ORDER — DONEPEZIL HCL 5 MG PO TABS
10.0000 mg | ORAL_TABLET | Freq: Every day | ORAL | Status: DC
  Administered 2014-03-13 – 2014-03-20 (×8): 10 mg via ORAL
  Filled 2014-03-13 (×8): qty 2

## 2014-03-13 MED ORDER — MELATONIN 2.5 MG PO CAPS: 10.0000 mg | ORAL_CAPSULE | Freq: Every day | ORAL | Status: DC

## 2014-03-13 MED ORDER — SODIUM CHLORIDE 0.9 % IV SOLN
INTRAVENOUS | Status: DC
  Administered 2014-03-13: 15:00:00 via INTRAVENOUS

## 2014-03-13 MED ORDER — SODIUM CHLORIDE 0.9 % IJ SOLN
3.0000 mL | Freq: Two times a day (BID) | INTRAMUSCULAR | Status: DC
  Administered 2014-03-13 – 2014-03-26 (×20): 3 mL via INTRAVENOUS

## 2014-03-13 MED ORDER — ESCITALOPRAM OXALATE 10 MG PO TABS
20.0000 mg | ORAL_TABLET | Freq: Every day | ORAL | Status: DC
  Administered 2014-03-14 – 2014-03-21 (×8): 20 mg via ORAL
  Filled 2014-03-13 (×9): qty 2

## 2014-03-13 MED ORDER — ALPRAZOLAM 0.25 MG PO TABS
0.2500 mg | ORAL_TABLET | Freq: Every day | ORAL | Status: DC
  Administered 2014-03-14: 0.25 mg via ORAL
  Filled 2014-03-13: qty 1

## 2014-03-13 MED ORDER — PANTOPRAZOLE SODIUM 40 MG PO TBEC
40.0000 mg | DELAYED_RELEASE_TABLET | Freq: Every day | ORAL | Status: DC
  Administered 2014-03-14 – 2014-03-26 (×10): 40 mg via ORAL
  Filled 2014-03-13 (×14): qty 1

## 2014-03-13 MED ORDER — SODIUM CHLORIDE 0.9 % IV SOLN
INTRAVENOUS | Status: AC
  Administered 2014-03-13: 1000 mL via INTRAVENOUS
  Administered 2014-03-14: 07:00:00 via INTRAVENOUS

## 2014-03-13 MED ORDER — ALUM & MAG HYDROXIDE-SIMETH 200-200-20 MG/5ML PO SUSP: 30.0000 mL | Freq: Four times a day (QID) | ORAL | Status: DC | PRN

## 2014-03-13 NOTE — ED Provider Notes (Signed)
CSN: [id number]     Arrival date & time 03/13/14  1401 History  This chart was scribed for Leota Jacobsen, MD by Roxan Diesel, ED scribe.  This patient was seen in room APA06/[facility]06 and the patient's care was started at 2:38 PM.   Chief Complaint  Patient presents with  . Fatigue    The history is provided by the patient and a relative. No language interpreter was used.    HPI Comments: [name] is a 80 y.o. female with h/o aortic valve stenosis, HTN, hyperlipidemia, and arthritis who presents to the Emergency Department complaining of generalized weakness, fatigue and mild confusion that began 2 days ago.  Family report that pt fell at home 3 days ago.  The fall was not witnessed but EMS came to her house.  She had some bruising on her back but denied any head injury at that time and had no evidence of head trauma.  EMS recommended she come to the hospital but pt declined.  Pt was sore after the fall but was otherwise in her usual state of health.  However the following day she became lethargic and generally weak, and "broke out in a sweat" when she went out to eat with family.  She has also become more SOB when walking than usual.  Today she has been less talkative than usual and has been wanting to sleep.  Family report she is usually very outgoing and does not take naps.  She has also seemed slightly confused and tioday did not recognize her gardener who she has had for several years.  Pt denies urinary problems, cough, CP, or fever.  Pt lives at home alone and has caregivers who come see her.    Past Medical History  Diagnosis Date  . Lumbar facet arthropathy   . Primary localized osteoarthrosis, hand   . Lumbosacral spondylosis without myelopathy   . Pain in joint, lower leg   . Hypertension   . Hyperlipidemia   . Palpitation   . Aortic valve stenosis   . Murmur 04/2012    2D Echo EF 65-70%, she did have moderate aortic stenosis with a mean gradient of 15, maximum  gradient 21, and calculated aortic valve area of 1.05 cm2 to 1.1 cm2, she did have moderate tricuspid regurgitation and moderate pulmonary hypentension with a PA pressure of 54mm, there was trace of mild MR.    Past Surgical History  Procedure Laterality Date  . Abdominal hysterectomy    . Spine surgery    . Breast surgery    . Appendectomy  50 yrs ago  . Cholecystectomy    . Hiatal hernia repair    . Tonsillectomy  88yrs ago    Family History  Problem Relation Age of Onset  . Cancer Father   . Stroke Sister   . Cancer Sister   . Multiple sclerosis Brother     History  Substance Use Topics  . Smoking status: Never Smoker   . Smokeless tobacco: Never Used  . Alcohol Use: No    OB History   Grav Para Term Preterm Abortions TAB SAB Ect Mult Living                   Review of Systems  Constitutional: Positive for diaphoresis, activity change and fatigue. Negative for fever.  Respiratory: Positive for shortness of breath. Negative for cough.   Cardiovascular: Negative for chest pain.  Genitourinary: Negative for dysuria.  Neurological: Positive for weakness.  Psychiatric/Behavioral: Positive for confusion.  All other systems reviewed and are negative.     Allergies  Metoclopramide hcl  Home Medications   Prior to Admission medications   Medication Sig Start Date End Date Taking? Authorizing Provider  acetaminophen (TYLENOL) 650 MG CR tablet Take 1 tablet (650 mg total) by mouth every 8 (eight) hours as needed. 12/18/13   Rogene Houston, MD  ALPRAZolam Duanne Moron) 0.25 MG tablet Take 0.25 mg by mouth daily after lunch.     Historical Provider, MD  amLODipine (NORVASC) 10 MG tablet TAKE 1 TABLET BY MOUTH DAILY. 10/08/13   Troy Sine, MD  calcium-vitamin D (OSCAL) 250-125 MG-UNIT per tablet Take 1 tablet by mouth daily.    Historical Provider, MD  donepezil (ARICEPT) 10 MG tablet Take 10 mg by mouth at bedtime.  12/06/13   Historical Provider, MD  escitalopram  (LEXAPRO) 10 MG tablet Take 20 mg by mouth daily.     Historical Provider, MD  esomeprazole (NEXIUM) 20 MG capsule Take 20 mg by mouth daily at 12 noon.    Historical Provider, MD  ferrous sulfate dried (SLOW FE) 160 (50 FE) MG TBCR SR tablet Take 160 mg by mouth daily.    Historical Provider, MD  loperamide (IMODIUM) 2 MG capsule Take 1 capsule (2 mg total) by mouth daily. 11/20/12   Rogene Houston, MD  Melatonin 2.5 MG CAPS Take 10 mg by mouth at bedtime.     Historical Provider, MD  OVER THE COUNTER MEDICATION Sleep Aid 50 mg -patient takes daily at bedtime.    Historical Provider, MD  Probiotic Product (PROBIOTIC DAILY PO) Take by mouth daily.    Historical Provider, MD  ramipril (ALTACE) 5 MG capsule Take 5 mg by mouth daily.    Historical Provider, MD  vitamin B-12 (CYANOCOBALAMIN) 1000 MCG tablet Take 1,000 mcg by mouth daily.     Historical Provider, MD   BP 121/35  Pulse 85  Temp(Src) 97.5 F (36.4 C) (Oral)  Resp 16  Ht 5' 5.5" (1.664 m)  Wt 148 lb (67.132 kg)  BMI 24.25 kg/m2  SpO2 95%  Physical Exam  Nursing note and vitals reviewed. Constitutional: She is oriented to person, place, and time. She appears well-developed and well-nourished.  Non-toxic appearance. No distress.  HENT:  Head: Normocephalic and atraumatic.  Eyes: Conjunctivae, EOM and lids are normal. Pupils are equal, round, and reactive to light.  Neck: Normal range of motion. Neck supple. No tracheal deviation present. No mass present.  Cardiovascular: Normal rate, regular rhythm and normal heart sounds.  Exam reveals no gallop.   No murmur heard. Pulmonary/Chest: Effort normal. No stridor. No respiratory distress. She has no decreased breath sounds. She has no wheezes. She has rhonchi. She has no rales.  Faint rhonchi bilaterally  Abdominal: Soft. Normal appearance and bowel sounds are normal. She exhibits no distension. There is no tenderness. There is no rebound and no CVA tenderness.  Musculoskeletal:  Normal range of motion. She exhibits no edema and no tenderness.  Neurological: She is alert and oriented to person, place, and time. She has normal strength. No cranial nerve deficit or sensory deficit. Coordination normal. GCS eye subscore is 4. GCS verbal subscore is 5. GCS motor subscore is 6.  Skin: Skin is warm and dry. No abrasion and no rash noted.  Psychiatric: She has a normal mood and affect. Her speech is normal and behavior is normal.    ED Course  Procedures (including critical care  time)  DIAGNOSTIC STUDIES: Oxygen Saturation is 95% on room air, adequate by my interpretation.    COORDINATION OF CARE: 2:45 PM-Discussed treatment plan which includes IV fluids, CXR, head CT, and labs with pt and family at bedside and they agreed to plan.     Labs Review Labs Reviewed  CBC  BASIC METABOLIC PANEL  PRO B NATRIURETIC PEPTIDE  TROPONIN I    Imaging Review No results found.   EKG Interpretation None      MDM   Final diagnoses:  None   Date: 03/13/2014  Rate: 82  Rhythm: normal sinus rhythm  QRS Axis: left   Intervals: normal  ST/T Wave abnormalities: normal  Conduction Disutrbances:none  Narrative Interpretation:   Old EKG Reviewed: none available  Patient given Kayexalate 15 g by mouth. Will be admitted for dehydration.      Leota Jacobsen, MD 03/13/14 [phone]

## 2014-03-13 NOTE — Plan of Care (Signed)
Problem: Consults Goal: General Medical Patient Education See Patient Education Module for specific education. Outcome: Progressing Patient admitted with weakness, acute renal, dehydration, Goal: Skin Care Protocol Initiated - if Braden Score 18 or less If consults are not indicated, leave blank or document N/A Outcome: Progressing Pink foam applied to skin due to possible scratching where top layer of epidermis is denuded Goal: Nutrition Consult-if indicated Outcome: Progressing Patient states she used to weigh 180 lbs, but feels she is eating enough  Problem: Phase I Progression Outcomes Goal: Pain controlled with appropriate interventions Outcome: Progressing Osteoporosis, and restlessness, causing achiness Goal: OOB as tolerated unless otherwise ordered Outcome: Progressing Patient oob to bsc with moderate assist, would be unsteady without standby contact guard assist

## 2014-03-13 NOTE — ED Notes (Signed)
Family states pt out of breath more and more nontalkative as usual

## 2014-03-13 NOTE — ED Notes (Signed)
Attempted x 2 to obtain ua via quick cath- unsuccessful - MD informed, Awaiting results of Ct before  further attempts .

## 2014-03-13 NOTE — H&P (Signed)
Patient seen, independently examined and chart reviewed. I agree with exam, assessment and plan discussed with Dyanne Carrel, NP.  80 year old woman still very independent, ambulates without assistance device, who fell 7/4 at home. She was able to get up by herself and ambulate without difficulty therefore did not come to the hospital. The last 48 hours she has become more lethargic, somewhat more confused and generally weak. Daughter brought her to the hospital for further evaluation where she was found to have acute renal, hyperkalemia and hypercalcemia.  PMH Early dementia her family Aortic valve stenosis  Subjective: Currently she feels well, has no complaints.  Objective: Afebrile, vital signs are stable.  Gen. Appears calm and comfortable. Exam and in the emergency department.  Psych. Alert. Speech fluent and appropriate. Follows simple commands.  Eyes. Pupils round, equal, grossly unremarkable lids, irises.  ENT. Very hard of hearing. Lips appear unremarkable.  Neck. No lymphadenopathy or masses. No thyromegaly.  Cardiovascular. Regular rate and rhythm. 2/6 systolic murmur. No rub or gallop. No lower chimney edema.  Respiratory. Clear to auscultation bilaterally. No wheezes, rales or rhonchi. Normal respiratory effort.  Abdomen. Soft, nontender, nondistended  Skin. Grossly unremarkable  Musculoskeletal. Grossly unremarkable  Neurologic. Grossly nonfocal   Chemistry: Potassium 6.0. BUN 53, creatinine 1.99. Calcium 12.2. Anion gap normal. Troponin negative. BNP modestly elevated.  Heme: Hemoglobin 10.6. CBC otherwise unremarkable.  ID: Urinalysis negative.  Other: EKG no acute findings: sinus rhythm, left axis deviation, first degree AV block, inferior septal MI, old. Compared to previous study 08/11/2012, these findings are old.  Imaging: CT head no acute findings. Incidental enlarging bone lesion of the right zygoma. Chest x-ray unremarkable.  Acute renal failure,  dehydration. Suspect relates decreased oral intake. Admitted for hydration, further evaluation if no improvement.  Hyperkalemia. No acute EKG changes. Given Kayexalate in the ER. Repeat BMP later tonight. Monitor on telemetry.  Hypercalcemia. Significance unclear. Hydrate, recheck in the morning. If remains elevated consider further evaluation.  Acute encephalopathy appears mild. The patient is at baseline short-term memory loss. Monitor clinically.  Normocytic anemia. Significance unclear. Repeat CBC in the morning.  Reported mechanical fall approximately 7/5 without loss of consciousness or closed head injury.  Abnormal findings on head CT to be followed up in the outpatient setting and do not appear to have any clinical significance at this point. Followup as an outpatient.  Murray Hodgkins, MD [facility] [phone]

## 2014-03-13 NOTE — Progress Notes (Signed)
PHARMACIST - PHYSICIAN ORDER COMMUNICATION  CONCERNING: P&T Medication Policy on Herbal Medications  DESCRIPTION:  This patient's order for:  Melatonin 10mg   has been noted.  This product(s) is classified as an "herbal" or natural product. Due to a lack of definitive safety studies or FDA approval, nonstandard manufacturing practices, plus the potential risk of unknown drug-drug interactions while on inpatient medications, the Pharmacy and Therapeutics Committee does not permit the use of "herbal" or natural products of this type within [facility].   ACTION TAKEN: The pharmacy department is unable to verify this order at this time and your patient has been informed of this safety policy. Please reevaluate patient's clinical condition at discharge and address if the herbal or natural product(s) should be resumed at that time.  Jerrye Beavers, PharmD, BCPS Clinical Pharmacist

## 2014-03-13 NOTE — H&P (Signed)
[facility] History and Physical  [name] NWG:[id number] DOB: [birth year] DOA: 03/13/2014  Referring physician:  PCP: Purvis Kilts, MD   Chief Complaint: weakness  HPI: [name] is a 80 y.o. female with past medical history that includes hypertension, hyperlipidemia, aortic valve stenosis presents to the emergency department with chief complaint of weakness. Information is obtained from the patient and her daughter who is at the bedside. Initial workup in the emergency room reveals acute renal failure, hyperkalemia, hyponatremia, dehydration and anemia. Daughter reports that patient fell 3 days ago. Patient reports mechanical fall and denies any loss of consciousness dizziness syncope or near-syncope.  EMS was called and recommended she come to the hospital but she refused Patient lives alone with caregivers but fall was not witnessed. The day after the fall she complained of some soreness and developed bruising on her back she maintained her mobility. The following day she became somewhat lethargic with intermittent confusion and not eating or drinking her normal amount. She was unable to bear weight and ambulates to the bathroom without assistance. She denies chest pain palpitation headache visual disturbances numbness or tingling of extremities. She denies fever chills abdominal pain nausea vomiting diarrhea or constipation. She denies dysuria hematuria frequency or urgency. Basic metabolic panel significant for sodium of 135, potassium 6.0 creatinine 1.99 calcium 12.2, proBNP 1434. Chest x-ray with no active cardiopulmonary disease. EKG with normal sinus rhythm and prolonged PR interval. CT of the head with Incidental detection of enlarging bone lesion of the right zygoma otherwise no acute change  At the time of my exam she is alert and oriented to self and place she is hemodynamically stable she is afebrile and not hypoxic. In the emergency department she received  15 g of Kayexalate.   Review of Systems:  10 point review of systems completed and all systems are negative except as indicated in the history of present illness  Past Medical History  Diagnosis Date  . Lumbar facet arthropathy   . Primary localized osteoarthrosis, hand   . Lumbosacral spondylosis without myelopathy   . Pain in joint, lower leg   . Hypertension   . Hyperlipidemia   . Palpitation   . Aortic valve stenosis   . Murmur 04/2012    2D Echo EF 65-70%, she did have moderate aortic stenosis with a mean gradient of 15, maximum gradient 21, and calculated aortic valve area of 1.05 cm2 to 1.1 cm2, she did have moderate tricuspid regurgitation and moderate pulmonary hypentension with a PA pressure of 37mm, there was trace of mild MR.   Past Surgical History  Procedure Laterality Date  . Abdominal hysterectomy    . Spine surgery    . Breast surgery    . Appendectomy  50 yrs ago  . Cholecystectomy    . Hiatal hernia repair    . Tonsillectomy  77yrs ago   Social History:  reports that she has never smoked. She has never used smokeless tobacco. She reports that she does not drink alcohol or use illicit drugs. Patient lives in her home alone with caregivers during the day and at night. Her daughter lives close by and supervise his care. She is independent with ADLs ambulatory without assistance able to make once and needs known but does have short-term memory issues Allergies  Allergen Reactions  . Metoclopramide Hcl     Family History  Problem Relation Age of Onset  . Cancer Father   . Stroke Sister   . Cancer  Sister   . Multiple sclerosis Brother      Prior to Admission medications   Medication Sig Start Date End Date Taking? Authorizing Provider  acetaminophen (TYLENOL) 650 MG CR tablet Take 1 tablet (650 mg total) by mouth every 8 (eight) hours as needed. 12/18/13   Rogene Houston, MD  ALPRAZolam Duanne Moron) 0.25 MG tablet Take 0.25 mg by mouth daily after lunch.      Historical Provider, MD  amLODipine (NORVASC) 10 MG tablet TAKE 1 TABLET BY MOUTH DAILY. 10/08/13   Troy Sine, MD  calcium-vitamin D (OSCAL) 250-125 MG-UNIT per tablet Take 1 tablet by mouth daily.    Historical Provider, MD  donepezil (ARICEPT) 10 MG tablet Take 10 mg by mouth at bedtime.  12/06/13   Historical Provider, MD  escitalopram (LEXAPRO) 10 MG tablet Take 20 mg by mouth daily.     Historical Provider, MD  esomeprazole (NEXIUM) 20 MG capsule Take 20 mg by mouth daily at 12 noon.    Historical Provider, MD  ferrous sulfate dried (SLOW FE) 160 (50 FE) MG TBCR SR tablet Take 160 mg by mouth daily.    Historical Provider, MD  loperamide (IMODIUM) 2 MG capsule Take 1 capsule (2 mg total) by mouth daily. 11/20/12   Rogene Houston, MD  Melatonin 2.5 MG CAPS Take 10 mg by mouth at bedtime.     Historical Provider, MD  OVER THE COUNTER MEDICATION Sleep Aid 50 mg -patient takes daily at bedtime.    Historical Provider, MD  Probiotic Product (PROBIOTIC DAILY PO) Take by mouth daily.    Historical Provider, MD  ramipril (ALTACE) 5 MG capsule Take 5 mg by mouth daily.    Historical Provider, MD  vitamin B-12 (CYANOCOBALAMIN) 1000 MCG tablet Take 1,000 mcg by mouth daily.     Historical Provider, MD   Physical Exam: Filed Vitals:   03/13/14 1516  BP: 122/48  Pulse: 81  Temp:   Resp: 22    BP 122/48  Pulse 81  Temp(Src) 97.5 F (36.4 C) (Oral)  Resp 22  Ht 5' 5.5" (1.664 m)  Wt 67.132 kg (148 lb)  BMI 24.25 kg/m2  SpO2 95%  General:  Appears calm and comfortable, thin somewhat frail Eyes: PERRL, normal lids, irises & conjunctiva ENT: Ears clear nose without drainage oropharynx without erythema or exudate. Mucous membranes of her mouth are pink slightly dry. Very hard appearing Neck: no LAD, masses or thyromegaly Cardiovascular: RRR, +murmur. No LE edema. Respiratory: Normal effort breath sounds clear bilaterally no rhonchi no wheeze Abdomen: soft, ntnd positive bowel sounds  throughout Skin: no rash or induration seen on limited exam Musculoskeletal: Slightly poor muscle tone joints without erythema or swelling nontender to palpation full range of motion Psychiatric: grossly normal mood and affect, speech fluent and appropriate Neurologic: Cranial nerves II through XII grossly intact she is alert and oriented to self and place. Unable to report year or who the president is a lateral grip 5 out of 5 lower extremity strength 5 out of 5 speech is slow and deliberate but clear           Labs on Admission:  Basic Metabolic Panel:  Recent Labs Lab 03/13/14 1445  NA 135*  K 6.0*  CL 102  CO2 21  GLUCOSE 96  BUN 53*  CREATININE 1.99*  CALCIUM 12.2*   Liver Function Tests: No results found for this basename: AST, ALT, ALKPHOS, BILITOT, PROT, ALBUMIN,  in the last 168 hours No  results found for this basename: LIPASE, AMYLASE,  in the last 168 hours No results found for this basename: AMMONIA,  in the last 168 hours CBC:  Recent Labs Lab 03/13/14 1445  WBC 7.3  HGB 10.6*  HCT 31.0*  MCV 81.6  PLT 202   Cardiac Enzymes:  Recent Labs Lab 03/13/14 1445  TROPONINI <0.30    BNP (last 3 results)  Recent Labs  03/13/14 1445  PROBNP 1434.0*   CBG: No results found for this basename: GLUCAP,  in the last 168 hours  Radiological Exams on Admission: Dg Chest 2 View  03/13/2014   CLINICAL DATA:  pain  EXAM: CHEST  2 VIEW  COMPARISON:  Two-view chest 11/20/2013.  FINDINGS: The heart size and mediastinal contours are within normal limits. Atherosclerotic calcifications within the aorta. Both lungs are clear. The visualized skeletal structures are unremarkable.  IMPRESSION: No active cardiopulmonary disease.   Electronically Signed   By: Margaree Mackintosh M.D.   On: 03/13/2014 16:06   Ct Head Wo Contrast  03/13/2014   CLINICAL DATA:  Headache.  Weakness.  EXAM: CT HEAD WITHOUT CONTRAST  TECHNIQUE: Contiguous axial images were obtained from the base of the  skull through the vertex without intravenous contrast.  COMPARISON:  12/03/2008 and multiple previous  FINDINGS: There is no evidence of acute infarction. There is generalized brain atrophy. No focal insult is seen affecting the brainstem or cerebellum. 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 There is extensive chronic small vessel disease affecting the cerebral hemispheric white matter. No large vessel territory infarction, mass lesion, hemorrhage, hydrocephalus or extra-axial collection. No inflammatory sinus disease. No acute calvarial finding. 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 This measures approximately 2.4 cm in diameter presently. On the study 2010, this measured approximately 2.1 cm. In 2006, this was not visible. Therefore, this is behaving an an indolent fashion. The could represent a benign entity such as hemangioma of bone or an indolent malignancy, including chondrosarcoma. There is atherosclerotic calcification of the major vessels at the base of the brain.  IMPRESSION: No acute finding. Extensive chronic ischemic changes throughout the brain as outlined above.  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 See above discussion.   Electronically Signed   By: Nelson Chimes M.D.   On: 03/13/2014 16:34    EKG: Independently reviewed. Normal sinus rhythm  Assessment/Plan Principal Problem:   Acute renal failure: Likely related to dehydration secondary to decreased by mouth intake after a mechanical fall. Will admit. Will gently hydrate with IV fluids. Will hold any nephrotoxins. Will also obtain a urinalysis. Will monitor intake and output. If no improvement will consider renal ultrasound. Active Problems:  Hyperkalemia; likely related to #1. She was given 15 g of Kayexalate in the emergency department. Will recheck her potassium level later tonight. If greater than 5 will consider a  second dose of Kayexalate. Will repeat in the a.m. as well  Hypercalcemia: May be related to dehydration. Will gently hydrate. Will recheck in the a.m.   Dehydration: Related to decreased by mouth intake after mechanical fall 3 days ago. Will gently hydrate with IV fluids. Will monitor her electrolytes. Provide heart healthy diet. Will check her orthostatics tomorrow morning.    Hypertension: Blood pressure 122/48 at the time of my exam. I medications include Norvasc. I will hold this for now. Closely monitor blood pressure and resume when indicated  Diarrhea:  chronic. She sees Dr.Rehman for this. Last visit April of this year. She takes Imodium on an as-needed basis. She reports loose stool about twice a week. GI note indicates stool studies have been negative and that she is not a candidate for anti-spasmodic therapy. Continue Imodium on an as-needed basis. No recent loose stool.     GERD (gastroesophageal reflux disease); stable at baseline. Continue PPI     Hyperlipidemia: Continue home meds     Abnormal CT of brain     Code Status: DNR Family Communication: daughter at bedside Disposition Plan: home when ready  Time spent: 61 minutes  [facility] Pager [phone]  **Disclaimer: This note may have been dictated with voice recognition software. Similar sounding words can inadvertently be transcribed and this note may contain transcription errors which may not have been corrected upon publication of note.**

## 2014-03-13 NOTE — ED Notes (Signed)
Pt fell on Saturday, family unsure pt hitting head with fall, family states pt has became weaker and more fatigue each day since fall, pt lives at home with caretakers, no caretaker not present with fall per family

## 2014-03-14 LAB — CBC
HCT: 29.7 % — ABNORMAL LOW (ref 36.0–46.0)
Hemoglobin: 10.2 g/dL — ABNORMAL LOW (ref 12.0–15.0)
MCH: 27.9 pg (ref 26.0–34.0)
MCHC: 34.3 g/dL (ref 30.0–36.0)
MCV: 81.4 fL (ref 78.0–100.0)
PLATELETS: 191 10*3/uL (ref 150–400)
RBC: 3.65 MIL/uL — ABNORMAL LOW (ref 3.87–5.11)
RDW: 19.1 % — AB (ref 11.5–15.5)
WBC: 6.8 10*3/uL (ref 4.0–10.5)

## 2014-03-14 LAB — BASIC METABOLIC PANEL
ANION GAP: 9 (ref 5–15)
BUN: 52 mg/dL — ABNORMAL HIGH (ref 6–23)
CALCIUM: 11.9 mg/dL — AB (ref 8.4–10.5)
CHLORIDE: 106 meq/L (ref 96–112)
CO2: 22 mEq/L (ref 19–32)
CREATININE: 1.65 mg/dL — AB (ref 0.50–1.10)
GFR calc non Af Amer: 25 mL/min — ABNORMAL LOW (ref >90)
GFR, EST AFRICAN AMERICAN: 30 mL/min — AB (ref >90)
Glucose, Bld: 85 mg/dL (ref 70–99)
Potassium: 4.9 mEq/L (ref 3.7–5.3)
SODIUM: 137 meq/L (ref 137–147)

## 2014-03-14 MED ORDER — TRAZODONE HCL 50 MG PO TABS
50.0000 mg | ORAL_TABLET | Freq: Every day | ORAL | Status: DC
  Administered 2014-03-14: 50 mg via ORAL
  Filled 2014-03-14: qty 1

## 2014-03-14 MED ORDER — SODIUM CHLORIDE 0.9 % IV SOLN
INTRAVENOUS | Status: AC
  Administered 2014-03-14 (×2): via INTRAVENOUS

## 2014-03-14 NOTE — Progress Notes (Signed)
Nutrition Brief Note  Patient identified on the Malnutrition Screening Tool (MST) Report  Wt Readings from Last 15 Encounters:  03/13/14 148 lb (67.132 kg)  12/18/13 139 lb 14.4 oz (63.458 kg)  04/02/13 65 lb 8 oz (29.711 kg)  01/01/13 144 lb 8 oz (65.545 kg)  11/20/12 141 lb 6.4 oz (64.139 kg)  10/17/12 147 lb 4.8 oz (66.815 kg)  10/03/12 148 lb (67.132 kg)  03/07/12 152 lb (68.947 kg)  01/10/12 150 lb (68.04 kg)  03/25/08 183 lb (83.008 kg)    Body mass index is 24.25 kg/(m^2). Patient meets criteria for normal range based on current BMI. Distant hx of weight loss which is stable for past 2 years per family.   Current diet order is Heart healthy , patient is consuming approximately n/a % of meals at this time. She feeds herself and her daughter reports very good appetite prior to admission.  Labs and medications reviewed.   No nutrition interventions warranted at this time. If nutrition issues arise, please consult RD.   Colman Cater MS,RD,CSG,LDN Office: [phone] Pager: [phone]

## 2014-03-14 NOTE — Evaluation (Signed)
Physical Therapy Evaluation Patient Details Name: [name] MRN: [id number] DOB: September 18, 1917 Today's Date: 03/14/2014   History of Present Illness  [name] is a 80 y.o. female with past medical history that includes hypertension, hyperlipidemia, aortic valve stenosis presents to the emergency department with chief complaint of weakness. Information is obtained from the patient and her daughter who is at the bedside. Initial workup in the emergency room reveals acute renal failure, hyperkalemia, hyponatremia, dehydration and anemia.  Daughter reports that patient fell 3 days ago. Patient reports mechanical fall and denies any loss of consciousness dizziness syncope or near-syncope.  EMS was called and recommended she come to the hospital but she refused Patient lives alone with caregivers but fall was not witnessed. The day after the fall she complained of some soreness and developed bruising on her back she maintained her mobility. The following day she became somewhat lethargic with intermittent confusion and not eating or drinking her normal amount. She was unable to bear weight and ambulates to the bathroom without assistance. She denies chest pain palpitation headache visual disturbances numbness or tingling of extremities. She denies fever chills abdominal pain nausea vomiting diarrhea or constipation. She denies dysuria hematuria frequency or urgency.    Clinical Impression  Pt is a 80 year old female who presents to physical therapy with dx of acute renal failure.  Pt lives alone, though has family support every day from 10-8; family reports they are available more if needed.  Pt lives in a multilevel home with 1 step to enter; pt does not use the basement.  Prior to hospitalization and fall, pt was mod (I) with bed mobility skills, transfers and household amb without AD.  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  2 falls reported in past year.  Pt does hold onto family arm for  community amb.  During evaluation, pt required min assist for bed mobility skills, and min guard and use of RW for transfers and short distance amb skills.  Amb distance limited secondary to fatigue with activity.  REcommend continued PT services to address strengthening, activity tolerance for improvement of functional mobility skills with transition to HHPT at discharge.  No DME recommendations.     Follow Up Recommendations Home health PT    Equipment Recommendations  None recommended by PT       Precautions / Restrictions Precautions Precautions: Fall Restrictions Weight Bearing Restrictions: No      Mobility  Bed Mobility Overal bed mobility: Needs Assistance Bed Mobility: Supine to Sit;Sit to Supine     Supine to sit: Min assist (for trunk) Sit to supine: Modified independent (Device/Increase time)      Transfers Overall transfer level: Needs assistance Equipment used: Rolling walker (2 wheeled) Transfers: Sit to/from Stand Sit to Stand: Min guard            Ambulation/Gait Ambulation/Gait assistance: Min guard Ambulation Distance (Feet): 12 Feet Assistive device: Rolling walker (2 wheeled) Gait Pattern/deviations: Step-through pattern;Decreased step length - right;Decreased step length - left   Gait velocity interpretation: Below normal speed for age/gender General Gait Details: Unable to continue amb secondary to fatigue.       Balance Overall balance assessment: History of Falls;Needs assistance (2 falls in past year) Sitting-balance support: Feet supported;Single extremity supported Sitting balance-Leahy Scale: Good     Standing balance support: Bilateral upper extremity supported;During functional activity (On RW with min guard) Standing balance-Leahy Scale: Fair  Pertinent Vitals/Pain No pain reported.     Home Living Family/patient expects to be discharged to:: Private residence Living Arrangements:  Alone Available Help at Discharge: Family (Family normally stays with patient from 10-8/7 days week, available more often if needed) Type of Home: House Home Access: Stairs to enter Entrance Stairs-Rails: None Entrance Stairs-Number of Steps: 1 Home Layout: Multi-level Home Equipment: Environmental consultant - 2 wheels;Cane - quad;Bedside commode;Shower seat      Prior Function Level of Independence: Independent with assistive device(s)         Comments: Family reports pt is mod (I) with bed mobility skills, transfers, and household amb without AD.  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          Extremity/Trunk Assessment               Lower Extremity Assessment: Generalized weakness         Communication   Communication: HOH (Speak in Rt ear)  Cognition Arousal/Alertness: Awake/alert Behavior During Therapy: WFL for tasks assessed/performed Overall Cognitive Status: Within Functional Limits for tasks assessed                        Assessment/Plan    PT Assessment Patient needs continued PT services  PT Diagnosis Difficulty walking;Generalized weakness   PT Problem List Decreased strength;Decreased cognition;Decreased activity tolerance;Decreased safety awareness;Decreased balance;Decreased mobility  PT Treatment Interventions Balance training;Gait training;Neuromuscular re-education;Functional mobility training;Therapeutic activities;Therapeutic exercise   PT Goals (Current goals can be found in the Care Plan section) Acute Rehab PT Goals Patient Stated Goal: none stated    Frequency Min 3X/week   Barriers to discharge        Co-evaluation               End of Session Equipment Utilized During Treatment: Gait belt Activity Tolerance: Patient limited by fatigue Patient left: in bed;with call bell/phone within reach Nurse Communication: Mobility status         Time: 8469-6295 PT Time Calculation (min): 20 min   Charges:   PT  Evaluation $Initial PT Evaluation Tier I: 1 Procedure      [place] 03/14/2014, 11:34 AM

## 2014-03-14 NOTE — Progress Notes (Signed)
[facility] PROGRESS NOTE  [name] IOM:[id number] DOB: 21-Nov-1917 DOA: 03/13/2014 PCP: Purvis Kilts, MD   Summary 80 year old woman still very independent, ambulates without assistance device, who fell 7/4 at home. She was able to get up by herself and ambulate without difficulty therefore did not come to the hospital. Developed lethargy and became somewhat more confused and generally weak over the next 2 days. Daughter brought her to the hospital for further evaluation where she was found to have acute renal, hyperkalemia and hypercalcemia  Assessment/Plan: Acute renal failure: Likely related to dehydration secondary to decreased by mouth intake after a mechanical fall. Improving with IV fluids. Will continue to hold nephrotoxins. Urinalysis unremarkable. Urine output fair. Will continue IV fluids and monitor for continued improvement. Await PT evaluation. Active Problems:  Hyperkalemia; likely related to #1. No acute EKG changes. Resolved. Monitor   Hypercalcemia: minimal improvement with hydration. Will continue to hydrate with IV fluids. Recheck in am. If no improvement will need further evaluation   Dehydration: Related to decreased by mouth intake after mechanical fall 3 days ago. Improving with IV fluids. Not orthostatic from lying to sitting. Continue fluids as above. Monitor  Hypertension: Fair control. Home norvasc held.    GERD (gastroesophageal reflux disease); stable at baseline. Continue PPI   Abnormal CT of brain: likely benign. Follow up as outpatient as clinically indicated   Code Status: DNR Family Communication: daughter at bedside Disposition Plan: home hopefully tomorrow   Consultants:  none  Procedures:  none  Antibiotics:  none  HPI/Subjective: Sitting up in bed eating breakfast. Reports feeling "ok". Denies pain/discomfort  Objective: Filed Vitals:   03/14/14 0800  BP: 141/44  Pulse:   Temp:   Resp: 18     Intake/Output Summary (Last 24 hours) at 03/14/14 1120 Last data filed at 03/14/14 1000  Gross per 24 hour  Intake 1032.92 ml  Output    675 ml  Net 357.92 ml   Filed Weights   03/13/14 1403  Weight: 67.132 kg (148 lb)    Exam:   General:  Somewhat frail appearing NAD  Cardiovascular: RRR+murmur no LE edema PPP  Respiratory: normal effort BS clear bilaterally no wheeze or crackles  Abdomen: non-distended +BS non-tender to palpation  Musculoskeletal: only fair muscle tone. Joints without erythema/swelling   Data Reviewed: Basic Metabolic Panel:  Recent Labs Lab 03/13/14 1445 03/13/14 2152 03/14/14 0526  NA 135* 136* 137  K 6.0* 5.2 4.9  CL 102 101 106  CO2 21 19 22   GLUCOSE 96 104* 85  BUN 53* 51* 52*  CREATININE 1.99* 1.77* 1.65*  CALCIUM 12.2* 12.3* 11.9*   Liver Function Tests: No results found for this basename: AST, ALT, ALKPHOS, BILITOT, PROT, ALBUMIN,  in the last 168 hours No results found for this basename: LIPASE, AMYLASE,  in the last 168 hours No results found for this basename: AMMONIA,  in the last 168 hours CBC:  Recent Labs Lab 03/13/14 1445 03/14/14 0526  WBC 7.3 6.8  HGB 10.6* 10.2*  HCT 31.0* 29.7*  MCV 81.6 81.4  PLT 202 191   Cardiac Enzymes:  Recent Labs Lab 03/13/14 1445  TROPONINI <0.30   BNP (last 3 results)  Recent Labs  03/13/14 1445  PROBNP 1434.0*   CBG: No results found for this basename: GLUCAP,  in the last 168 hours  Recent Results (from the past 240 hour(s))  MRSA PCR SCREENING     Status: None   Collection Time  03/13/14  7:30 PM      Result Value Ref Range Status   MRSA by PCR NEGATIVE  NEGATIVE Final   Comment:            The GeneXpert MRSA Assay (FDA     approved for NASAL specimens     only), is one component of a     comprehensive MRSA colonization     surveillance program. It is not     intended to diagnose MRSA     infection nor to guide or     monitor treatment for     MRSA  infections.     Studies: Dg Chest 2 View  03/13/2014   CLINICAL DATA:  pain  EXAM: CHEST  2 VIEW  COMPARISON:  Two-view chest 11/20/2013.  FINDINGS: The heart size and mediastinal contours are within normal limits. Atherosclerotic calcifications within the aorta. Both lungs are clear. The visualized skeletal structures are unremarkable.  IMPRESSION: No active cardiopulmonary disease.   Electronically Signed   By: Margaree Mackintosh M.D.   On: 03/13/2014 16:06   Ct Head Wo Contrast  03/13/2014   CLINICAL DATA:  Headache.  Weakness.  EXAM: CT HEAD WITHOUT CONTRAST  TECHNIQUE: Contiguous axial images were obtained from the base of the skull through the vertex without intravenous contrast.  COMPARISON:  12/03/2008 and multiple previous  FINDINGS: There is no evidence of acute infarction. There is generalized brain atrophy. No focal insult is seen affecting the brainstem or cerebellum. 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 There is extensive chronic small vessel disease affecting the cerebral hemispheric white matter. No large vessel territory infarction, mass lesion, hemorrhage, hydrocephalus or extra-axial collection. No inflammatory sinus disease. No acute calvarial finding. 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 This measures approximately 2.4 cm in diameter presently. On the study 2010, this measured approximately 2.1 cm. In 2006, this was not visible. Therefore, this is behaving an an indolent fashion. The could represent a benign entity such as hemangioma of bone or an indolent malignancy, including chondrosarcoma. There is atherosclerotic calcification of the major vessels at the base of the brain.  IMPRESSION: No acute finding. Extensive chronic ischemic changes throughout the brain as outlined above.  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 See above discussion.   Electronically Signed   By:  Nelson Chimes M.D.   On: 03/13/2014 16:34    Scheduled Meds: . ALPRAZolam  0.25 mg Oral QPC lunch  . donepezil  10 mg Oral QHS  . enoxaparin (LOVENOX) injection  30 mg Subcutaneous Q24H  . escitalopram  20 mg Oral Daily  . pantoprazole  40 mg Oral Daily  . sodium chloride  3 mL Intravenous Q12H   Continuous Infusions: . sodium chloride 100 mL/hr at 03/14/14 1000    Principal Problem:   Acute renal failure Active Problems:   Diarrhea   GERD (gastroesophageal reflux disease)   Dehydration   Hypertension   Hyperlipidemia   Hyperkalemia   Abnormal CT of brain   Hypercalcemia    Time spent: [place] [facility] Pager [phone]. If 7PM-7AM, please contact night-coverage at [email or web address], password [facility] 03/14/2014, 11:20 AM  LOS: 1 day

## 2014-03-14 NOTE — Care Management Note (Addendum)
    Page 1 of 2   03/27/2014     11:56:00 AM CARE MANAGEMENT NOTE 03/27/2014  Patient:  [name], [name]   Account Number:  [id number]  Date Initiated:  03/14/2014  Documentation initiated by:  Theophilus Kinds  Subjective/Objective Assessment:   Pt admitted from home with ARF. Pt lives alone and has hired caregivers 16 hours a day. Pt is alone at night. Pt has a cane and walker for home use but does not use it.     Action/Plan:   Pt may need HH at discharge. Pt would like [facility] if HH needed. Will continue to follow for discharge planning needs.   Anticipated DC Date:  03/16/2014   Anticipated DC Plan:  [place]  CM consult      [facility] Choice  HOME HEALTH   Choice offered to / List presented to:  C-4 Adult Children        HH arranged  HH-1 RN  [place].   Status of service:  Completed, signed off Medicare Important Message given?  YES (If response is "NO", the following Medicare IM given date fields will be blank) Date Medicare IM given:  03/15/2014 Medicare IM given by:  Christinia Gully C Date Additional Medicare IM given:  03/25/2014 Additional Medicare IM given by:  Theophilus Kinds  Discharge Disposition:  [place]  Per UR Regulation:    If discussed at Long Length of Stay Meetings, dates discussed:   03/19/2014  03/21/2014  03/26/2014    Comments:  03/27/14 1155 Christinia Gully, RN BSN CM Pt discharged to [facility]. CSW to arrange discharge to facility.  03/25/14 [place], RN BSN CM MD to discuss hospice/hospice home with pts daughter this afternoon. CSW has checked with [facility] and they have beds. If daughter chooses to persue that, CSW to arrange discharge to facility. If she chooses to send pt to SNF, CSW to arrange that as well. IM delivered.  03/19/14 [place], RN BSN CM Pt for US guided  biopsy on 03/21/14. [place] arrangements have been made. Will continue to follow for discharge planning needs. 03/15/14 [place], RN BSN CM Pt potential discharge over the weekend. Pt has agreed to [facility] RN and PT with [facility] (per daughter choice). Emma with [facility] is aware and will collect the pts information from the chart. [place] services to start within 48 hours of discharge. No DME needs noted. Weekend staff to arrange.  03/11/14 [place], RN BSN CM

## 2014-03-14 NOTE — Progress Notes (Signed)
Patient seen, independently examined and chart reviewed. I agree with exam, assessment and plan discussed with Dyanne Carrel, NP.  Subjective: No issues overnight. No pain. No nausea or vomiting. No complaints.  Objective: Afebrile, vital signs are stable. No hypoxia.  Gen. Appears calm, comfortable.  Psych. Alert. Speech fluent and clear.  ENT. Very hard of hearing.  Cardiovascular. Regular rate and rhythm. 2/6 systolic murmur. No rub or gallop. No lower 20 edema. Telemetry sinus rhythm.  Respiratory. Clear to auscultation bilaterally. No wheezes, rales or rhonchi. Normal respiratory effort.   I/O: 300+ one void. BM x1.  Chemistry: Creatinine trending downwards 1.99 >> 1.65. Potassium normalized. Calcium modestly improved 12.2 >> 11.9  Heme: Hemoglobin stable at 10.2.  ID: Urine culture pending.   Acute renal failure, dehydration. Improved with IV fluids. Suspect secondary to decreased oral intake after fall.   Hyperkalemia. Resolved. Secondary to acute renal failure.  Hypercalcemia. Modestly improved. Could be related to dehydration. If fails to normalize would suggest outpatient evaluation.  Possible acute encephalopathy. Appears resolved.  Normocytic anemia. Stable. No bleeding. Followup as an outpatient.  Mechanical fall prior to admission July 5. No apparent sequela.  Abnormal head CT. Likely benign, followup as an outpatient as clinically indicated.  Discussed above with daughter at bedside, would anticipate discharge in the next 24 hours. Plan physical therapy consultation.  Murray Hodgkins, MD [facility] [phone]

## 2014-03-14 NOTE — Progress Notes (Signed)
UR chart review completed.  

## 2014-03-15 ENCOUNTER — Encounter (HOSPITAL_COMMUNITY): Payer: Self-pay | Admitting: Internal Medicine

## 2014-03-15 ENCOUNTER — Inpatient Hospital Stay (HOSPITAL_COMMUNITY): Payer: Medicare Other

## 2014-03-15 DIAGNOSIS — R740 Nonspecific elevation of levels of transaminase and lactic acid dehydrogenase [LDH]: Secondary | ICD-10-CM

## 2014-03-15 DIAGNOSIS — G9340 Encephalopathy, unspecified: Secondary | ICD-10-CM

## 2014-03-15 DIAGNOSIS — R7401 Elevation of levels of liver transaminase levels: Secondary | ICD-10-CM

## 2014-03-15 LAB — BASIC METABOLIC PANEL
Anion gap: 11 (ref 5–15)
BUN: 43 mg/dL — AB (ref 6–23)
CO2: 22 mEq/L (ref 19–32)
CREATININE: 1.33 mg/dL — AB (ref 0.50–1.10)
Calcium: 12.4 mg/dL — ABNORMAL HIGH (ref 8.4–10.5)
Chloride: 105 mEq/L (ref 96–112)
GFR, EST AFRICAN AMERICAN: 38 mL/min — AB (ref >90)
GFR, EST NON AFRICAN AMERICAN: 33 mL/min — AB (ref >90)
Glucose, Bld: 95 mg/dL (ref 70–99)
Potassium: 4.6 mEq/L (ref 3.7–5.3)
Sodium: 138 mEq/L (ref 137–147)

## 2014-03-15 LAB — CBC
HCT: 33 % — ABNORMAL LOW (ref 36.0–46.0)
Hemoglobin: 11.3 g/dL — ABNORMAL LOW (ref 12.0–15.0)
MCH: 27.8 pg (ref 26.0–34.0)
MCHC: 34.2 g/dL (ref 30.0–36.0)
MCV: 81.1 fL (ref 78.0–100.0)
PLATELETS: 190 10*3/uL (ref 150–400)
RBC: 4.07 MIL/uL (ref 3.87–5.11)
RDW: 19.4 % — ABNORMAL HIGH (ref 11.5–15.5)
WBC: 7.9 10*3/uL (ref 4.0–10.5)

## 2014-03-15 LAB — URINE CULTURE: Colony Count: 60000

## 2014-03-15 LAB — LACTATE DEHYDROGENASE: LDH: 531 U/L — ABNORMAL HIGH (ref 94–250)

## 2014-03-15 LAB — HEPATIC FUNCTION PANEL
ALT: 41 U/L — AB (ref 0–35)
AST: 60 U/L — ABNORMAL HIGH (ref 0–37)
Albumin: 2.4 g/dL — ABNORMAL LOW (ref 3.5–5.2)
Alkaline Phosphatase: 310 U/L — ABNORMAL HIGH (ref 39–117)
Bilirubin, Direct: <0.2 mg/dL (ref 0.0–0.3)
Total Bilirubin: 0.5 mg/dL (ref 0.3–1.2)
Total Protein: 5.2 g/dL — ABNORMAL LOW (ref 6.0–8.3)

## 2014-03-15 LAB — CORTISOL: CORTISOL PLASMA: 32.9 ug/dL

## 2014-03-15 LAB — LIPASE, BLOOD: LIPASE: 40 U/L (ref 11–59)

## 2014-03-15 MED ORDER — SODIUM CHLORIDE 0.9 % IV SOLN
60.0000 mg | Freq: Once | INTRAVENOUS | Status: AC
  Administered 2014-03-15: 60 mg via INTRAVENOUS
  Filled 2014-03-15: qty 20

## 2014-03-15 MED ORDER — AMLODIPINE BESYLATE 5 MG PO TABS
5.0000 mg | ORAL_TABLET | Freq: Every day | ORAL | Status: DC
  Administered 2014-03-15 – 2014-03-17 (×3): 5 mg via ORAL
  Filled 2014-03-15 (×3): qty 1

## 2014-03-15 MED ORDER — HYDRALAZINE HCL 10 MG PO TABS
10.0000 mg | ORAL_TABLET | Freq: Four times a day (QID) | ORAL | Status: DC | PRN
  Administered 2014-03-15 – 2014-03-17 (×2): 10 mg via ORAL
  Filled 2014-03-15 (×2): qty 1

## 2014-03-15 MED ORDER — SODIUM CHLORIDE 0.9 % IV SOLN
INTRAVENOUS | Status: DC
  Administered 2014-03-15 – 2014-03-18 (×7): via INTRAVENOUS

## 2014-03-15 NOTE — Progress Notes (Signed)
[facility] PROGRESS NOTE  [name] ZDG:[id number] DOB: 12/31/1917 DOA: 03/13/2014 PCP: Purvis Kilts, MD  Summary  80 year old woman still very independent, ambulates without assistance device, who fell 7/4 at home. She was able to get up by herself and ambulate without difficulty therefore did not come to the hospital. Developed lethargy and became somewhat more confused and generally weak over the next 2 days. Daughter brought her to the hospital for further evaluation where she was found to have acute renal, hyperkalemia and hypercalcemia   Assessment/Plan: Acute renal failure: Likely related to dehydration secondary to decreased by mouth intake after a mechanical fall. Continues to improve with IV fluids. Urinalysis unremarkable. Urine output improving as well. Will continue IV fluids and monitor for continued improvement.   Active Problems:  Hypercalcemia: No improvement with hydration. Concern for para-hyperthyroidism vs malignancy. Will obtain TSH, cortisol, parathyroid hormone, ionized calcium, PTH related peptide, Vit B12. LDH 521, lipase within normal limits, ast 60 alt 41 alk phos 310. Will increase fluids and start pamidronate. Request nephrology consult.  Monitor  Elevated transaminase: etiology uncertain. Will obtain abdominal US.   Acute encephalopathy: metabolic. Continues with lethargy, weakness decreased po intake. CT head without acute changes on admission. Takes 0.25 xanax daily and trazadone at night. Will discontinue these.  May be related to hypercalcemia. See above.  Hyperkalemia; likely related to #1. No acute EKG changes. Resolved. Monitor   Dehydration: resolved. Related to decreased by mouth intake after mechanical fall 3 days ago. Improving with IV fluids. Not orthostatic from lying to sitting. Continue fluids as above. Monitor   Hypertension: Fair control. Resume home norvasc at lower dose.   GERD (gastroesophageal reflux disease); stable at  baseline. Continue PPI   Abnormal CT of brain: likely benign. Follow up as outpatient as clinically indicated   Code Status: DNR Family Communication: daughter on phone Disposition Plan: home when ready   Consultants:  nephrology  Procedures:  none  Antibiotics:  none  HPI/Subjective: Awake but lethargic. Denies pain  Objective: Filed Vitals:   03/15/14 0800  BP: 137/54  Pulse: 91  Temp: 98 F (36.7 C)  Resp: 20    Intake/Output Summary (Last 24 hours) at 03/15/14 1324 Last data filed at 03/15/14 1115  Gross per 24 hour  Intake   1000 ml  Output    400 ml  Net    600 ml   Filed Weights   03/13/14 1403  Weight: 67.132 kg (148 lb)    Exam:   General:  Thin somewhat pale and appears somewhat ill  Cardiovascular: RRR +murmur no LE [place]  Respiratory: normal effort bs clear bilaterally no crackles or wheeze  Abdomen: soft +BS non-tender to palpation  Musculoskeletal: fair muscle tone. Joints without swelling/erythema   Data Reviewed: Basic Metabolic Panel:  Recent Labs Lab 03/13/14 1445 03/13/14 2152 03/14/14 0526 03/15/14 0503  NA 135* 136* 137 138  K 6.0* 5.2 4.9 4.6  CL 102 101 106 105  CO2 21 19 22 22   GLUCOSE 96 104* 85 95  BUN 53* 51* 52* 43*  CREATININE 1.99* 1.77* 1.65* 1.33*  CALCIUM 12.2* 12.3* 11.9* 12.4*   Liver Function Tests:  Recent Labs Lab 03/15/14 1006  AST 60*  ALT 41*  ALKPHOS 310*  BILITOT 0.5  PROT 5.2*  ALBUMIN 2.4*    Recent Labs Lab 03/15/14 1159  LIPASE 40   No results found for this basename: AMMONIA,  in the last 168 hours CBC:  Recent Labs Lab  03/13/14 1445 03/14/14 0526 03/15/14 0503  WBC 7.3 6.8 7.9  HGB 10.6* 10.2* 11.3*  HCT 31.0* 29.7* 33.0*  MCV 81.6 81.4 81.1  PLT 202 191 190   Cardiac Enzymes:  Recent Labs Lab 03/13/14 1445  TROPONINI <0.30   BNP (last 3 results)  Recent Labs  03/13/14 1445  PROBNP 1434.0*   CBG: No results found for this basename: GLUCAP,  in the  last 168 hours  Recent Results (from the past 240 hour(s))  URINE CULTURE     Status: None   Collection Time    03/13/14  4:45 PM      Result Value Ref Range Status   Specimen Description URINE, CLEAN CATCH   Final   Special Requests NONE   Final   Culture  Setup Time     Final   Value: 03/14/2014 04:28     Performed at [facility]     Final   Value: 60,000 COLONIES/ML     Performed at [facility]   Culture     Final   Value: Multiple bacterial morphotypes present, none predominant. Suggest appropriate recollection if clinically indicated.     Performed at [facility]   Report Status 03/15/2014 FINAL   Final  MRSA PCR SCREENING     Status: None   Collection Time    03/13/14  7:30 PM      Result Value Ref Range Status   MRSA by PCR NEGATIVE  NEGATIVE Final   Comment:            The GeneXpert MRSA Assay (FDA     approved for NASAL specimens     only), is one component of a     comprehensive MRSA colonization     surveillance program. It is not     intended to diagnose MRSA     infection nor to guide or     monitor treatment for     MRSA infections.     Studies: Dg Chest 2 View  03/13/2014   CLINICAL DATA:  pain  EXAM: CHEST  2 VIEW  COMPARISON:  Two-view chest 11/20/2013.  FINDINGS: The heart size and mediastinal contours are within normal limits. Atherosclerotic calcifications within the aorta. Both lungs are clear. The visualized skeletal structures are unremarkable.  IMPRESSION: No active cardiopulmonary disease.   Electronically Signed   By: Margaree Mackintosh M.D.   On: 03/13/2014 16:06   Ct Head Wo Contrast  03/13/2014   CLINICAL DATA:  Headache.  Weakness.  EXAM: CT HEAD WITHOUT CONTRAST  TECHNIQUE: Contiguous axial images were obtained from the base of the skull through the vertex without intravenous contrast.  COMPARISON:  12/03/2008 and multiple previous  FINDINGS: There is no evidence of acute infarction. There is generalized brain  atrophy. No focal insult is seen affecting the brainstem or cerebellum. 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 There is extensive chronic small vessel disease affecting the cerebral hemispheric white matter. No large vessel territory infarction, mass lesion, hemorrhage, hydrocephalus or extra-axial collection. No inflammatory sinus disease. No acute calvarial finding. 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 This measures approximately 2.4 cm in diameter presently. On the study 2010, this measured approximately 2.1 cm. In 2006, this was not visible. Therefore, this is behaving an an indolent fashion. The could represent a benign entity such as hemangioma of bone or an indolent  malignancy, including chondrosarcoma. There is atherosclerotic calcification of the major vessels at the base of the brain.  IMPRESSION: No acute finding. Extensive chronic ischemic changes throughout the brain as outlined above.  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 See above discussion.   Electronically Signed   By: Nelson Chimes M.D.   On: 03/13/2014 16:34    Scheduled Meds: . amLODipine  5 mg Oral Daily  . donepezil  10 mg Oral QHS  . enoxaparin (LOVENOX) injection  30 mg Subcutaneous Q24H  . escitalopram  20 mg Oral Daily  . pantoprazole  40 mg Oral Daily  . sodium chloride  3 mL Intravenous Q12H   Continuous Infusions: . sodium chloride 150 mL/hr at 03/15/14 1020    Principal Problem:   Acute renal failure Active Problems:   Diarrhea   GERD (gastroesophageal reflux disease)   Dehydration   Hypertension   Hyperlipidemia   Hyperkalemia   Abnormal CT of brain   Hypercalcemia   Acute encephalopathy   Elevated transaminase level    Time spent: 35 minutes    [place] [facility] Pager [phone]. If 7PM-7AM, please contact night-coverage at [email or web address], password [facility] 03/15/2014,  1:24 PM  LOS: 2 days

## 2014-03-15 NOTE — Progress Notes (Signed)
Patient transferred to room 325. Daughter at side. VSS stable. Report given to Leroy Kennedy RN.

## 2014-03-15 NOTE — Progress Notes (Signed)
Patient seen, independently examined and chart reviewed. I agree with exam, assessment and plan discussed with Dyanne Carrel, NP.  Subjective: She remains somewhat lethargic, but "clearly not herself" per her aid ("walks without a walker, bright, energetic, gregarious"). Remains somewhat generally weak. The patient herself denies any complaints.  Objective: Afebrile, vital signs stable. No hypoxia.  Gen. Appears calm and comfortable nontoxic, sitting in chair.  Psych. Alert. Speech fluent and clear. Oriented to self, location, not month or year. Does not recognize aide at bedside.  Eyes. Comparison was unremarkable.  ENT. Lips and tongue appear unremarkable.  Neck. No thyromegaly, lymphadenopathy or masses.  Cardiovascular. Regular rate and rhythm. No murmur, or gallop. No lower extremity edema.  Respiratory. Clear to auscultation bilaterally. No wheezes, rales or rhonchi. Normal respiratory effort.  Chest: Both breast examined with chaperone present--no masses appreciated. There is a small area of keratotic skin on the nipple in the left breast.  Abdomen. Soft, nontender, nondistended no masses appreciated.  Skin. Otherwise unremarkable  Musculoskeletal. Moves all extremities to command  Neurologic. Grossly nonfocal     I/O: 800+3 voids. BM x1.  Chemistry: Renal function continues to improve, creatinine 1.65 >> 1.33 with BUN trending downwards. Calcium remains elevated, 12.4 despite hydration.    Heme: Hemoglobin stable 11.3.  ID: Urine culture multiple bacterial morphotypes present none predominant.  Acute renal failure resolving rapidly with IV fluids. Initially suspected to be secondary to reported vomiting and decreased oral intake after fall. However begin to suspect hypercalcemia as etiology.  Acute encephalopathy. Urinalysis and infectious workup unremarkable. Begin to suspect hypercalcemia. Plan as below.  Hypercalcemia. Improved yesterday but higher again today.  Not improving with improvement in renal failure and hydration. Primary hyperparathyroidism and malignancy most common etiologies. Patient does have a history of breast cancer treated with lumpectomy 10 more years ago. No radiation or chemotherapy. Last mammogram several years Does not take vitamin A, vitamin D or calcium supplementation as an outpatient. Not on a thiazide, no offending medications.  Has symptoms to suggest symptomatic hypercalcemia including generalized weakness, history of confusion, nausea, vomiting. Discussed with oncology. Will proceed with workup including TSH, cortisol, CMP/albumin, ionized calcium, intact PTH, PTH-related protein (PTHrp), 1,25-dihydroxyvitamin D, serum protein electrophoresis, b-2 microglobin. Nephrology consultation to rule out renal etiology. Based on results consider oncology consultation. If workup unrevealing, would proceed with CT scans to assess for malignancy. The left breast nipple does not appear frankly cancerous at this point.  Pamidronate, discussed with pharmacy, no contraindication with GFR >30.  Discussed in detail with daughter at bedside the above. She understands malignancy is high in the differential.  Time 40 minutes including greater than 50% in counseling and coordination of care

## 2014-03-16 ENCOUNTER — Inpatient Hospital Stay (HOSPITAL_COMMUNITY): Payer: Medicare Other

## 2014-03-16 LAB — COMPREHENSIVE METABOLIC PANEL
ALT: 35 U/L (ref 0–35)
AST: 48 U/L — AB (ref 0–37)
Albumin: 2.1 g/dL — ABNORMAL LOW (ref 3.5–5.2)
Alkaline Phosphatase: 275 U/L — ABNORMAL HIGH (ref 39–117)
Anion gap: 10 (ref 5–15)
BUN: 43 mg/dL — ABNORMAL HIGH (ref 6–23)
CHLORIDE: 106 meq/L (ref 96–112)
CO2: 20 mEq/L (ref 19–32)
Calcium: 12.2 mg/dL — ABNORMAL HIGH (ref 8.4–10.5)
Creatinine, Ser: 1.29 mg/dL — ABNORMAL HIGH (ref 0.50–1.10)
GFR calc Af Amer: 40 mL/min — ABNORMAL LOW (ref >90)
GFR calc non Af Amer: 34 mL/min — ABNORMAL LOW (ref >90)
Glucose, Bld: 90 mg/dL (ref 70–99)
POTASSIUM: 4.1 meq/L (ref 3.7–5.3)
Sodium: 136 mEq/L — ABNORMAL LOW (ref 137–147)
Total Bilirubin: 0.5 mg/dL (ref 0.3–1.2)
Total Protein: 4.5 g/dL — ABNORMAL LOW (ref 6.0–8.3)

## 2014-03-16 LAB — TSH: TSH: 1.19 u[IU]/mL (ref 0.350–4.500)

## 2014-03-16 LAB — CALCIUM, IONIZED: Calcium, Ion: 1.87 mmol/L (ref 1.13–1.30)

## 2014-03-16 LAB — CLOSTRIDIUM DIFFICILE BY PCR: CDIFFPCR: NEGATIVE

## 2014-03-16 NOTE — Progress Notes (Signed)
03/16/14 2000  What Happened  Was fall witnessed? No  Was patient injured? Unsure  Patient found on floor  Found by Staff-comment (found by ED nurse tech and RN from floor)  Stated prior activity other (comment) (was on bsc with call bell in hand and told to call twice)  Follow Up  MD notified yes lama via [place]  Time MD notified 2025  Family notified No- patient refusal  Additional tests Yes-comment (head ct and ct c spine)  Simple treatment Dressing  Progress note created (see row info) Yes  Adult Fall Risk Assessment  Risk Factor Category (scoring not indicated) History of more than one fall within 6 months before admission (document High fall risk);High fall risk per protocol (document High fall risk);Fall has occurred during this admission (document High fall risk)  Patient's Fall Risk High Fall Risk (>13 points)  Adult Fall Risk Interventions  Required Bundle Interventions *See Row Information* High fall risk - low, moderate, and high requirements implemented  Additional Interventions Fall risk signage;Individualized elimination schedule;Protective devices (Comment);Secure all tubes/drains;Use of appropriate toileting equipment (bedpan, BSC, etc.)  Vitals  Temp 98.1 F (36.7 C)  Temp src Oral  BP ! 147/104 mmHg  MAP (mmHg) 112  BP Location Left arm  BP Method Automatic  Patient Position (if appropriate) Lying  Pulse Rate 98  Pulse Rate Source Dinamap  Oxygen Therapy  SpO2 94 %  O2 Device None (Room air)  Pain Assessment  Pain Assessment 0-10  Pain Score 5  Pain Type Acute pain  Pain Location Head  Pain Descriptors / Indicators Aching  Pain Onset Sudden  Patients Stated Pain Goal 0  Neurological  Neuro (WDL) WDL  Level of Consciousness Alert  Orientation Level Oriented X4  Cognition Follows commands;Impulsive  Speech Clear  Pupil Assessment  Yes  R Pupil Size (mm) 2  R Pupil Shape Round  R Pupil Reaction Brisk  L Pupil Size (mm) 2  L Pupil Shape Round  L  Pupil Reaction Brisk  Additional Pupil Assessments No  Neuro Symptoms None  Glasgow Coma Scale  Eye Opening 4  Best Verbal Response (NON-intubated) 5  Best Motor Response 6  Glasgow Coma Scale Score 15  Musculoskeletal  Musculoskeletal (WDL) X  Assistive Device BSC  Generalized Weakness Yes  Weight Bearing Restrictions No  Integumentary  Integumentary (WDL) X  Skin Integrity Skin tear  Skin Tear Location Elbow  Skin Tear Location Orientation Right  Skin Tear Intervention Cleansed;Gauze  Skin Turgor Non-tenting

## 2014-03-16 NOTE — Progress Notes (Signed)
Pt requested help to restroom. Assisted to bsc by rn with no issues. Gave call light and instructed twice to push call bell when finished using restroom and not to get up without assistance. RN left room to go to assist another pt when returning to room pt found in floor. Skin tear on elbow present. Pt c/o headache. MD and [facility] notified. VS O2 of 94% HR 98 BP 147/104. Stabilizing spine/neck at this time. Pt alert and oriented no loss of consciousness.

## 2014-03-16 NOTE — Progress Notes (Signed)
Critical Value Alert: Calcium, ionized is 1.87. Notified by lab at 2:17am. MD paged at 2:18am.

## 2014-03-16 NOTE — Progress Notes (Signed)
PROGRESS NOTE  [name] WNU:[id number] DOB: 1918-06-13 DOA: 03/13/2014 PCP: Purvis Kilts, MD  Summary: 80 year old woman still very independent, ambulates without assistance device, who fell 7/4 at home. She became more lethargic, somewhat more confused and generally weak. Daughter brought her to the hospital for further evaluation where she was found to have acute renal, hyperkalemia and hypercalcemia. Despite normalization of creatinine and resolution of hyperkalemia, remains hypercalcemia. It is likely hypercalcemia underlying etiology of presentation.  Assessment/Plan: 1. Hypercalcemia, symptomatic with confusion, generalized weakness, lethargy, decreased appetite. Persistent without significant change. Status post pamidronate 7/10. Suspect secondary to malignancy versus hyperparathyroidism. Extensive laboratory investigation in process. 2. Acute renal failure, resolving with IV fluids. Resolving. 3. Acute encephalopathy. As above. Thought secondary to hypercalcemia.  4. Elevated LFTs, trending downwards. Ultrasound of the abdomen with mild diffuse fatty infiltration of the liver which may be the etiology. Monitor clinically.  5. Hyperkalemia secondary to acute renal failure. Resolved with hydration. 6. History of breast cancer treated with lumpectomy, no chemotherapy or radiation, perhaps 10 years ago. 7. Early dementia   Continue IV hydration, follow daily calcium level, expect calcium level to start to trend down the next 24-48 hours. If laboratory studies do not suggest hyperparathyroidism, plan further investigation with CT chest abdomen and pelvis to assess for malignancy.  Will discuss with family and RN.  Code Status: DNR DVT prophylaxis: Lovenox Family Communication:  Disposition Plan: home with HH PT  Murray Hodgkins, MD  [facility]  Pager [phone] If 7PM-7AM, please contact night-coverage at [email or web address], password [facility] 03/16/2014, 9:07 AM  LOS: 3  days   Consultants:  Physical therapy: Home health.  Procedures:    Antibiotics:    HPI/Subjective: No new issues charted overnight.  Remains confused and sleepy but easily arouses. Decreased appetite. Patient herself denies pain, nausea, vomiting, complaints.  Objective: Filed Vitals:   03/15/14 1333 03/15/14 2112 03/15/14 2119 03/16/14 0651  BP: 156/45 173/50 176/50 140/38  Pulse: 92 96  87  Temp: 98.2 F (36.8 C) 98.1 F (36.7 C)  97.7 F (36.5 C)  TempSrc: Oral Oral  Oral  Resp: 18 20    Height:      Weight:      SpO2: 95% 94%  94%    Intake/Output Summary (Last 24 hours) at 03/16/14 0907 Last data filed at 03/16/14 0755  Gross per 24 hour  Intake   2950 ml  Output   1050 ml  Net   1900 ml     Filed Weights   03/13/14 1403  Weight: 67.132 kg (148 lb)    Exam:     Afebrile, vital signs stable. No hypoxia. Gen. Appears calm and comfortable.  Psych. Alert. Speech fluent and clear. Follow simple commands, nontoxic.  Cardiovascular. Regular rate and rhythm. No murmur, rub or gallop.  Respiratory. Clear to auscultation bilaterally. No wheezes, rales or rhonchi. Normal respiratory effort.  Abdomen. Soft, nontender, nondistended  Skin. Appears grossly unremarkable  Neurologic. Moves all extremities. Grossly nonfocal exam.  Data Reviewed: I/O: Adequate urine output. BM x3.  Chemistry: BUN stable, creatinine continues to trend towards normal, 1.29. Alkaline phosphatase, AST somewhat improved. ALT has normalized. Total bilirubin normal. Corrected calcium 13.7, no significant change.  Other: LDH elevated. Cortisol normal. Ionized calcium high. Multiple studies in process.  Imaging: Abdominal ultrasound with mild diffuse fatty infiltration of the liver.  Scheduled Meds: . amLODipine  5 mg Oral Daily  . donepezil  10 mg Oral QHS  . enoxaparin (LOVENOX) injection  30 mg Subcutaneous Q24H  . escitalopram  20 mg Oral Daily  . pantoprazole  40 mg  Oral Daily  . sodium chloride  3 mL Intravenous Q12H   Continuous Infusions: . sodium chloride 150 mL/hr at 03/16/14 8206    Principal Problem:   Acute renal failure Active Problems:   Diarrhea   GERD (gastroesophageal reflux disease)   Dehydration   Hypertension   Hyperlipidemia   Hyperkalemia   Abnormal CT of brain   Hypercalcemia   Acute encephalopathy   Elevated transaminase level   Time spent 20 minutes

## 2014-03-17 LAB — COMPREHENSIVE METABOLIC PANEL
ALT: 37 U/L — AB (ref 0–35)
AST: 51 U/L — ABNORMAL HIGH (ref 0–37)
Albumin: 2.2 g/dL — ABNORMAL LOW (ref 3.5–5.2)
Alkaline Phosphatase: 290 U/L — ABNORMAL HIGH (ref 39–117)
Anion gap: 12 (ref 5–15)
BUN: 43 mg/dL — ABNORMAL HIGH (ref 6–23)
CALCIUM: 11.8 mg/dL — AB (ref 8.4–10.5)
CO2: 20 meq/L (ref 19–32)
CREATININE: 1.31 mg/dL — AB (ref 0.50–1.10)
Chloride: 108 mEq/L (ref 96–112)
GFR, EST AFRICAN AMERICAN: 39 mL/min — AB (ref >90)
GFR, EST NON AFRICAN AMERICAN: 34 mL/min — AB (ref >90)
GLUCOSE: 81 mg/dL (ref 70–99)
Potassium: 3.9 mEq/L (ref 3.7–5.3)
Sodium: 140 mEq/L (ref 137–147)
Total Bilirubin: 0.6 mg/dL (ref 0.3–1.2)
Total Protein: 4.5 g/dL — ABNORMAL LOW (ref 6.0–8.3)

## 2014-03-17 MED ORDER — ALPRAZOLAM 0.25 MG PO TABS
0.2500 mg | ORAL_TABLET | Freq: Every evening | ORAL | Status: DC | PRN
  Administered 2014-03-18 – 2014-03-20 (×3): 0.25 mg via ORAL
  Filled 2014-03-17 (×4): qty 1

## 2014-03-17 MED ORDER — POLYVINYL ALCOHOL 1.4 % OP SOLN: 1.0000 [drp] | OPHTHALMIC | Status: DC | PRN

## 2014-03-17 MED ORDER — RAMIPRIL 2.5 MG PO CAPS
5.0000 mg | ORAL_CAPSULE | Freq: Every day | ORAL | Status: DC
  Administered 2014-03-17 – 2014-03-18 (×2): 5 mg via ORAL
  Filled 2014-03-17 (×2): qty 2

## 2014-03-17 MED ORDER — AMLODIPINE BESYLATE 5 MG PO TABS
5.0000 mg | ORAL_TABLET | Freq: Every day | ORAL | Status: DC
  Administered 2014-03-18 – 2014-03-26 (×9): 5 mg via ORAL
  Filled 2014-03-17 (×9): qty 1

## 2014-03-17 NOTE — Progress Notes (Signed)
Per patient family member (daughter), give Xanax at bedtime if patient appears anxious.

## 2014-03-17 NOTE — Progress Notes (Signed)
Notified MD about medication regimen.  Daughter requested that patient takes Norvasc and Xanax at bedtime and Ramipril 5 mg in morning.

## 2014-03-17 NOTE — Progress Notes (Signed)
PROGRESS NOTE  [name] JJH:[id number] DOB: 12-May-1918 DOA: 03/13/2014 PCP: Purvis Kilts, MD  Summary: 80 year old woman still very independent, ambulates without assistance device, who fell 7/4 at home. She became more lethargic, somewhat more confused and generally weak. Daughter brought her to the hospital for further evaluation where she was found to have acute renal, hyperkalemia and hypercalcemia. Despite normalization of creatinine and resolution of hyperkalemia, remains hypercalcemia. It is likely hypercalcemia underlying etiology of presentation.  Assessment/Plan: 1. Hypercalcemia, symptomatic with confusion, generalized weakness, lethargy, decreased appetite. Slightly improved status post pamidronate 7/10. Suspect secondary to malignancy versus hyperparathyroidism. Extensive laboratory investigation in process. 2. Acute renal failure, stabilizing with IV fluids. Adequate urine output. 3. Acute encephalopathy. Suspect related to hypercalcemia. Somewhat better today. 4. Elevated LFTs, stable. Ultrasound of the liver showed diffuse fatty infiltration which may partially explain findings. No abdominal pain. 5. Hyperkalemia secondary to acute renal failure. Resolved with hydration. 6. History of breast cancer treated with lumpectomy, no chemotherapy or radiation, perhaps 10 years ago. 7. Early dementia   Calcium very slightly improved. Much more alert today. Renal function stable. Hopefully we will see drop in calcium in the next 24 hours.  Discussed in detail with daughter. Plan physical therapy consultation, fluids, laboratory studies in the morning. May need to proceed with CT scan and laboratory studies are unrevealing.  May need skilled nursing facility on discharge.  Code Status: DNR DVT prophylaxis: Lovenox Family Communication:  Disposition Plan: home with HH PT  Murray Hodgkins, MD  [facility]  Pager [phone] If 7PM-7AM, please contact  night-coverage at [email or web address], password [facility] 03/17/2014, 2:52 PM  LOS: 4 days   Consultants:  Physical therapy: Home health.  Procedures:    Antibiotics:    HPI/Subjective: Patient was left alone on the commode last night and sustained a fall without apparent injury  Patient more awake today per family members although her appetite remains poor. She does not complain of any pain. She has no memory of last evening's events.  Objective: Filed Vitals:   03/16/14 2219 03/17/14 0217 03/17/14 0539 03/17/14 1440  BP: 153/52 124/58 160/59 150/50  Pulse: 96 84 96 90  Temp: 98.8 F (37.1 C) 98.6 F (37 C) 98.2 F (36.8 C) 98.4 F (36.9 C)  TempSrc: Oral Oral Oral Oral  Resp: 20 18 19 20   Height:      Weight:      SpO2: 96% 95% 95% 96%    Intake/Output Summary (Last 24 hours) at 03/17/14 1452 Last data filed at 03/17/14 1100  Gross per 24 hour  Intake    360 ml  Output    750 ml  Net   -390 ml     Filed Weights   03/13/14 1403  Weight: 67.132 kg (148 lb)    Exam:     Afebrile, vital signs stable. No hypoxia. Gen. Appears calm and comfortable. Psych. Very alert, speech fluent and clear. Follow simple commands. ENT. Hard of hearing. Cardiovascular. Regular rate and rhythm. No murmur, rub or gallop. No lower extremity edema. Respiratory. Clear to auscultation bilaterally. No wheezes, rales or rhonchi. Normal respiratory effort. Skin. Skin tear right elbow, skin otherwise appears unremarkable. Musculoskeletal. Moves all extremities well. Neurologic. Grossly nonfocal.  Data Reviewed: I/O: multiple voids. Oral intake 360. Chemistry: BUN, creatinine without significant change, 43/1.31. AST, ALT without significant change. Total bilirubin normal. Corrected calcium 13.2.  Scheduled Meds: . amLODipine  5 mg Oral Daily  . donepezil  10 mg Oral QHS  .  enoxaparin (LOVENOX) injection  30 mg Subcutaneous Q24H  . escitalopram  20 mg Oral Daily  . pantoprazole  40 mg  Oral Daily  . sodium chloride  3 mL Intravenous Q12H   Continuous Infusions: . sodium chloride 150 mL/hr at 03/16/14 1433    Principal Problem:   Acute renal failure Active Problems:   Diarrhea   GERD (gastroesophageal reflux disease)   Dehydration   Hypertension   Hyperlipidemia   Hyperkalemia   Abnormal CT of brain   Hypercalcemia   Acute encephalopathy   Elevated transaminase level   Time spent 20 minutes

## 2014-03-18 ENCOUNTER — Inpatient Hospital Stay (HOSPITAL_COMMUNITY): Payer: Medicare Other

## 2014-03-18 LAB — BASIC METABOLIC PANEL
Anion gap: 11 (ref 5–15)
BUN: 40 mg/dL — ABNORMAL HIGH (ref 6–23)
CALCIUM: 10.8 mg/dL — AB (ref 8.4–10.5)
CO2: 19 mEq/L (ref 19–32)
Chloride: 110 mEq/L (ref 96–112)
Creatinine, Ser: 1.3 mg/dL — ABNORMAL HIGH (ref 0.50–1.10)
GFR calc Af Amer: 39 mL/min — ABNORMAL LOW (ref >90)
GFR, EST NON AFRICAN AMERICAN: 34 mL/min — AB (ref >90)
GLUCOSE: 74 mg/dL (ref 70–99)
Potassium: 3.9 mEq/L (ref 3.7–5.3)
Sodium: 140 mEq/L (ref 137–147)

## 2014-03-18 LAB — PARATHYROID HORMONE, INTACT (NO CA): PTH: 6.9 pg/mL — ABNORMAL LOW (ref 14.0–72.0)

## 2014-03-18 LAB — BETA 2 MICROGLOBULIN, SERUM: BETA 2 MICROGLOBULIN: 14.5 mg/L — AB (ref <=2.51)

## 2014-03-18 MED ORDER — IOHEXOL 300 MG/ML  SOLN: 50.0000 mL | Freq: Once | INTRAMUSCULAR | Status: AC | PRN

## 2014-03-18 MED ORDER — IOHEXOL 300 MG/ML  SOLN: 80.0000 mL | Freq: Once | INTRAMUSCULAR | Status: AC | PRN

## 2014-03-18 MED ORDER — MEGESTROL ACETATE 400 MG/10ML PO SUSP
800.0000 mg | Freq: Every day | ORAL | Status: DC
  Administered 2014-03-18 – 2014-03-26 (×6): 800 mg via ORAL
  Filled 2014-03-18 (×10): qty 20

## 2014-03-18 NOTE — Progress Notes (Signed)
Physical Therapy Treatment Patient Details Name: [name] MRN: [id number] DOB: 1918-03-15 Today's Date: 03/18/2014    History of Present Illness      PT Comments    Bed mobility with min assistance and multimodal cueing for hand and foot placement with sit to stand and stand to sit.  Min guard with gait training using RW to restroom, sink to wash hands and ambulation to chair total 16 feet.  Cueing for handplacement for safety prior stand to sit.  Pt left in chair with call bell within reach and guest in room.    Follow Up Recommendations        Equipment Recommendations       Recommendations for Other Services       Precautions / Restrictions Precautions Precautions: Fall Restrictions Weight Bearing Restrictions: No    Mobility  Bed Mobility Overal bed mobility: Needs Assistance Bed Mobility: Supine to Sit     Supine to sit: Min assist     General bed mobility comments: Multimodal cueing for handplacement  Transfers Overall transfer level: Needs assistance Equipment used: Rolling walker (2 wheeled) Transfers: Sit to/from Stand Sit to Stand: Min guard         General transfer comment: Cueing for hand and foot placement for safety  Ambulation/Gait Ambulation/Gait assistance: Min guard Ambulation Distance (Feet): 16 Feet Assistive device: Rolling walker (2 wheeled) Gait Pattern/deviations: Decreased stride length;Trunk flexed   Gait velocity interpretation: Below normal speed for age/gender General Gait Details: Unable to continue amb secondary to fatigue.    Stairs            Wheelchair Mobility    Modified Rankin (Stroke Patients Only)       Balance                                    Cognition Arousal/Alertness: Awake/alert Behavior During Therapy: WFL for tasks assessed/performed Overall Cognitive Status: Within Functional Limits for tasks assessed                      Exercises      General  Comments        Subjective Care taker reports pt very sweaty this morning.  Reports pt has not ate anything, she spits it out.  Pt stated no current pain on Rt elbow from fall Saturday.      Home Living                      Prior Function            PT Goals (current goals can now be found in the care plan section) Progress towards PT goals: Progressing toward goals    Frequency       PT Plan Current plan remains appropriate    Co-evaluation             End of Session Equipment Utilized During Treatment: Gait belt Activity Tolerance: Patient limited by fatigue Patient left: in chair;with call bell/phone within reach;with chair alarm set;with family/visitor present     Time: 5465-6812 PT Time Calculation (min): 23 min  Charges:  $Gait Training: 8-22 mins $Therapeutic Activity: 8-22 mins                    G Codes:      Aldona Lento 03/18/2014, 11:52 AM

## 2014-03-18 NOTE — Progress Notes (Signed)
[facility] PROGRESS NOTE  [name] INO:[id number] DOB: 05-Sep-1918 DOA: 03/13/2014 PCP: Purvis Kilts, MD  Summary:  80 year old woman still very independent, ambulates without assistance device, who fell 7/4 at home. She became more lethargic, somewhat more confused and generally weak. Daughter brought her to the hospital for further evaluation where she was found to have acute renal, hyperkalemia and hypercalcemia. Despite normalization of creatinine and resolution of hyperkalemia, remains hypercalcemia. It is likely hypercalcemia underlying etiology of presentation.    Assessment/Plan: 1. Hypercalcemia, continues with symptoms of confusion, generalized weakness, lethargy, decreased appetite. Slightly improved status post pamidronate 7/10. Suspect secondary to malignancy versus hyperparathyroidism. Extensive laboratory investigation in process. Will obtain CT abdomen/pelvis and chest. 2. Acute renal failure, stabile with IV fluids. Adequate urine output. 3. Acute encephalopathy. Suspect related to hypercalcemia. Continues to improve very slowly. Refusing food today.  4. Elevated LFTs, stable. Ultrasound of the liver showed diffuse fatty infiltration which may partially explain findings. No abdominal pain. See #1.  5. Hyperkalemia secondary to acute renal failure. Resolved with hydration. 6. History of breast cancer treated with lumpectomy, no chemotherapy or radiation, perhaps 10 years ago. 7. Early dementia   Code Status: DNR Family Communication: caregiver at bedside Disposition Plan: home when ready   Consultants:  PT  Home Health  Procedures:  none  Antibiotics:  none  HPI/Subjective: Awake but lethargic. Reports "i dont feel too good". Denied pain/discomfort/nause  Objective: Filed Vitals:   03/18/14 0447  BP: 146/52  Pulse: 86  Temp: 98.5 F (36.9 C)  Resp: 20    Intake/Output Summary (Last 24 hours) at 03/18/14 1331 Last data filed at  03/18/14 1253  Gross per 24 hour  Intake   2545 ml  Output   1275 ml  Net   1270 ml   Filed Weights   03/13/14 1403  Weight: 67.132 kg (148 lb)    Exam:   General:  Somewhat pale NAD  Cardiovascular: RRR +murmur No LE edema  Respiratory: normal effort BS clear bilaterally no wheeze no crackles  Abdomen: 0bese soft +BS non-tender to palpation  Musculoskeletal: no clubbing or cyanosis  Data Reviewed: Basic Metabolic Panel:  Recent Labs Lab 03/14/14 0526 03/15/14 0503 03/16/14 0618 03/17/14 0530 03/18/14 0550  NA 137 138 136* 140 140  K 4.9 4.6 4.1 3.9 3.9  CL 106 105 106 108 110  CO2 22 22 20 20 19   GLUCOSE 85 95 90 81 74  BUN 52* 43* 43* 43* 40*  CREATININE 1.65* 1.33* 1.29* 1.31* 1.30*  CALCIUM 11.9* 12.4* 12.2* 11.8* 10.8*   Liver Function Tests:  Recent Labs Lab 03/15/14 1006 03/16/14 0618 03/17/14 0530  AST 60* 48* 51*  ALT 41* 35 37*  ALKPHOS 310* 275* 290*  BILITOT 0.5 0.5 0.6  PROT 5.2* 4.5* 4.5*  ALBUMIN 2.4* 2.1* 2.2*    Recent Labs Lab 03/15/14 1159  LIPASE 40   No results found for this basename: AMMONIA,  in the last 168 hours CBC:  Recent Labs Lab 03/13/14 1445 03/14/14 0526 03/15/14 0503  WBC 7.3 6.8 7.9  HGB 10.6* 10.2* 11.3*  HCT 31.0* 29.7* 33.0*  MCV 81.6 81.4 81.1  PLT 202 191 190   Cardiac Enzymes:  Recent Labs Lab 03/13/14 1445  TROPONINI <0.30   BNP (last 3 results)  Recent Labs  03/13/14 1445  PROBNP 1434.0*   CBG: No results found for this basename: GLUCAP,  in the last 168 hours  Recent Results (from the past 240  hour(s))  URINE CULTURE     Status: None   Collection Time    03/13/14  4:45 PM      Result Value Ref Range Status   Specimen Description URINE, CLEAN CATCH   Final   Special Requests NONE   Final   Culture  Setup Time     Final   Value: 03/14/2014 04:28     Performed at [place]     Final   Value: 60,000 COLONIES/ML     Performed at [facility]   Culture     Final   Value: Multiple bacterial morphotypes present, none predominant. Suggest appropriate recollection if clinically indicated.     Performed at [facility]   Report Status 03/15/2014 FINAL   Final  MRSA PCR SCREENING     Status: None   Collection Time    03/13/14  7:30 PM      Result Value Ref Range Status   MRSA by PCR NEGATIVE  NEGATIVE Final   Comment:            The GeneXpert MRSA Assay (FDA     approved for NASAL specimens     only), is one component of a     comprehensive MRSA colonization     surveillance program. It is not     intended to diagnose MRSA     infection nor to guide or     monitor treatment for     MRSA infections.  CLOSTRIDIUM DIFFICILE BY PCR     Status: None   Collection Time    03/16/14  7:20 PM      Result Value Ref Range Status   C difficile by pcr NEGATIVE  NEGATIVE Final     Studies: Ct Head Wo Contrast  03/16/2014   CLINICAL DATA:  Fall, headache and posterior neck pain  EXAM: CT HEAD WITHOUT CONTRAST  CT CERVICAL SPINE WITHOUT CONTRAST  TECHNIQUE: Multidetector CT imaging of the head and cervical spine was performed following the standard protocol without intravenous contrast. Multiplanar CT image reconstructions of the cervical spine were also generated.  COMPARISON:  03/13/2014  FINDINGS: CT HEAD FINDINGS  Mild cortical volume loss noted with proportional ventricular prominence. Areas of periventricular white matter hypodensity are most compatible with small vessel ischemic change. No acute hemorrhage, infarct, or mass lesion is identified. No midline shift. Remote left colonic and right external capsule lacunar infarcts are reidentified. Again noted is an expansile mass within an incompletely visualized portion of the right lateral orbital wall/ zygoma, unchanged since the most recent comparison exam. Evidence of scleral banding reidentified.  CT CERVICAL SPINE FINDINGS  C1 through the cervicothoracic junction is  visualized in its entirety. Multilevel disc degenerative change with uncovertebral joint hypertrophy noted, most prominent from C3 through C7. Mild neural foraminal narrowing at these levels is identified, most prominent at C5-C6. Mild multilevel facet osteoarthritic change is noted. No fracture or dislocation. Mild motion artifact is present at multiple levels.  IMPRESSION: No acute intracranial finding. Expansile right zygoma/orbital wall bony mass reidentified as previously discussed in report 03/13/2014, likely a benign finding with nonaggressive features.  Multilevel mid cervical spine degenerative change without focal acute finding.   Electronically Signed   By: Conchita Paris M.D.   On: 03/16/2014 22:20   Ct Cervical Spine Wo Contrast  03/16/2014   CLINICAL DATA:  Fall, headache and posterior neck pain  EXAM: CT HEAD WITHOUT CONTRAST  CT  CERVICAL SPINE WITHOUT CONTRAST  TECHNIQUE: Multidetector CT imaging of the head and cervical spine was performed following the standard protocol without intravenous contrast. Multiplanar CT image reconstructions of the cervical spine were also generated.  COMPARISON:  03/13/2014  FINDINGS: CT HEAD FINDINGS  Mild cortical volume loss noted with proportional ventricular prominence. Areas of periventricular white matter hypodensity are most compatible with small vessel ischemic change. No acute hemorrhage, infarct, or mass lesion is identified. No midline shift. Remote left colonic and right external capsule lacunar infarcts are reidentified. Again noted is an expansile mass within an incompletely visualized portion of the right lateral orbital wall/ zygoma, unchanged since the most recent comparison exam. Evidence of scleral banding reidentified.  CT CERVICAL SPINE FINDINGS  C1 through the cervicothoracic junction is visualized in its entirety. Multilevel disc degenerative change with uncovertebral joint hypertrophy noted, most prominent from C3 through C7. Mild neural  foraminal narrowing at these levels is identified, most prominent at C5-C6. Mild multilevel facet osteoarthritic change is noted. No fracture or dislocation. Mild motion artifact is present at multiple levels.  IMPRESSION: No acute intracranial finding. Expansile right zygoma/orbital wall bony mass reidentified as previously discussed in report 03/13/2014, likely a benign finding with nonaggressive features.  Multilevel mid cervical spine degenerative change without focal acute finding.   Electronically Signed   By: Conchita Paris M.D.   On: 03/16/2014 22:20    Scheduled Meds: . amLODipine  5 mg Oral QHS  . donepezil  10 mg Oral QHS  . enoxaparin (LOVENOX) injection  30 mg Subcutaneous Q24H  . escitalopram  20 mg Oral Daily  . pantoprazole  40 mg Oral Daily  . ramipril  5 mg Oral Daily  . sodium chloride  3 mL Intravenous Q12H   Continuous Infusions: . sodium chloride 75 mL/hr at 03/18/14 1003    Principal Problem:   Acute renal failure Active Problems:   Diarrhea   GERD (gastroesophageal reflux disease)   Dehydration   Hypertension   Hyperlipidemia   Hyperkalemia   Abnormal CT of brain   Hypercalcemia   Acute encephalopathy   Elevated transaminase level    Time spent: 35 minutes    [facility] Pager [phone]. If 7PM-7AM, please contact night-coverage at [email or web address], password [facility] [address] 03/18/2014, 1:31 PM  LOS: 5 days

## 2014-03-18 NOTE — Progress Notes (Signed)
Patient seen, independently examined and chart reviewed. I agree with exam, assessment and plan discussed with Dyanne Carrel, NP.  Subjective: About the same today. Patient has no complaints.  Poor appetite. Eating very little per private aide at bedside. Working fairly well physical therapy.  Objective: Afebrile, vital signs stable. No hypoxia.  Gen. Appears calm and comfortable. Speech fluent and clear. Appears confused.  Cardiovascular. Regular rate and rhythm. No murmur, gallop. No lower extremity edema.  Respiratory. Clear to auscultation bilaterally. No wheezes, rales or rhonchi. Normal respiratory effort.  Musculoskeletal. Moves all extremities to command.  Neurologic. Grossly nonfocal.   I/O: Urine output 650+ unmeasured blood. Bowel movement x3.  Chemistry: Creatinine stable 1.30.  Slowly trending down, 40. Calcium trending down, 12.4 corrected.  Imaging: CT chest abdomen and pelvis with anasarca and soft tissue nodularity. Prominent left lower quadrant abdominal subcutaneous soft tissue nodule may be amenable to biopsy.  Overall she remains about the same with poor appetite although she is quite alert and follows simple commands. Calcium slowly trending downward status post pamidronate although her mental status has not substantially improved. CT scans were performed without IV contrast because of her borderline kidney function and therefore somewhat limited. However, physical exam again are appreciated some fatty type subcutaneous masses. I discussed in detail the laboratory results and imaging findings with daughter at bedside and presented options including supportive care with no further investigation as well as biopsy and further evaluation. She elects to proceed with biopsy. We discussed prognosis is guarded. Likely nearing discharge in the next 48 hours.  Plan Megace to attempt to stimulate appetite. Stop fluids at this point given abdominal wall edema although she has no  lower extremity edema.  Murray Hodgkins, MD [facility] [phone]

## 2014-03-18 NOTE — Progress Notes (Signed)
Late entry:  Patient has bruise to left side of neck.  Patient has no complained of neck pain and able to turn her head without difficulty.  Dr. Sarajane Jews on unit and notified.  Will be in to see the patient.

## 2014-03-19 ENCOUNTER — Inpatient Hospital Stay (HOSPITAL_COMMUNITY): Payer: Medicare Other

## 2014-03-19 LAB — URINALYSIS, ROUTINE W REFLEX MICROSCOPIC
Glucose, UA: NEGATIVE mg/dL
HGB URINE DIPSTICK: NEGATIVE
Ketones, ur: NEGATIVE mg/dL
Nitrite: NEGATIVE
PH: 5.5 (ref 5.0–8.0)
SPECIFIC GRAVITY, URINE: 1.02 (ref 1.005–1.030)
Urobilinogen, UA: 0.2 mg/dL (ref 0.0–1.0)

## 2014-03-19 LAB — CBC
HCT: 27.8 % — ABNORMAL LOW (ref 36.0–46.0)
Hemoglobin: 9.4 g/dL — ABNORMAL LOW (ref 12.0–15.0)
MCH: 28.1 pg (ref 26.0–34.0)
MCHC: 33.8 g/dL (ref 30.0–36.0)
MCV: 83 fL (ref 78.0–100.0)
Platelets: ADEQUATE 10*3/uL (ref 150–400)
RBC: 3.35 MIL/uL — ABNORMAL LOW (ref 3.87–5.11)
RDW: 19.4 % — AB (ref 11.5–15.5)
WBC: 13.3 10*3/uL — AB (ref 4.0–10.5)

## 2014-03-19 LAB — PROTEIN ELECTROPHORESIS, SERUM
Albumin ELP: 54.5 % — ABNORMAL LOW (ref 55.8–66.1)
Alpha-1-Globulin: 7.1 % — ABNORMAL HIGH (ref 2.9–4.9)
Alpha-2-Globulin: 15.7 % — ABNORMAL HIGH (ref 7.1–11.8)
Beta 2: 4.3 % (ref 3.2–6.5)
Beta Globulin: 5.6 % (ref 4.7–7.2)
Gamma Globulin: 12.8 % (ref 11.1–18.8)
M-SPIKE, %: NOT DETECTED g/dL
TOTAL PROTEIN ELP: 4.5 g/dL — AB (ref 6.0–8.3)

## 2014-03-19 LAB — BASIC METABOLIC PANEL
Anion gap: 13 (ref 5–15)
BUN: 39 mg/dL — ABNORMAL HIGH (ref 6–23)
CALCIUM: 10.3 mg/dL (ref 8.4–10.5)
CO2: 17 mEq/L — ABNORMAL LOW (ref 19–32)
CREATININE: 1.33 mg/dL — AB (ref 0.50–1.10)
Chloride: 109 mEq/L (ref 96–112)
GFR calc Af Amer: 38 mL/min — ABNORMAL LOW (ref >90)
GFR calc non Af Amer: 33 mL/min — ABNORMAL LOW (ref >90)
GLUCOSE: 74 mg/dL (ref 70–99)
Potassium: 3.7 mEq/L (ref 3.7–5.3)
Sodium: 139 mEq/L (ref 137–147)

## 2014-03-19 LAB — HEPATIC FUNCTION PANEL
ALK PHOS: 287 U/L — AB (ref 39–117)
ALT: 39 U/L — AB (ref 0–35)
AST: 55 U/L — ABNORMAL HIGH (ref 0–37)
Albumin: 2.2 g/dL — ABNORMAL LOW (ref 3.5–5.2)
Bilirubin, Direct: 0.2 mg/dL (ref 0.0–0.3)
Indirect Bilirubin: 0.6 mg/dL (ref 0.3–0.9)
TOTAL PROTEIN: 4.7 g/dL — AB (ref 6.0–8.3)
Total Bilirubin: 0.8 mg/dL (ref 0.3–1.2)

## 2014-03-19 LAB — URINE MICROSCOPIC-ADD ON

## 2014-03-19 LAB — AMMONIA: Ammonia: 76 umol/L — ABNORMAL HIGH (ref 11–60)

## 2014-03-19 LAB — IMMUNOFIXATION ELECTROPHORESIS
IGG (IMMUNOGLOBIN G), SERUM: 555 mg/dL — AB (ref 690–1700)
IgA: 146 mg/dL (ref 69–380)
IgM, Serum: 218 mg/dL (ref 52–322)
TOTAL PROTEIN ELP: 4.8 g/dL — AB (ref 6.0–8.3)

## 2014-03-19 MED ORDER — RIFAXIMIN 550 MG PO TABS
550.0000 mg | ORAL_TABLET | Freq: Two times a day (BID) | ORAL | Status: DC
  Administered 2014-03-19 – 2014-03-26 (×13): 550 mg via ORAL
  Filled 2014-03-19 (×21): qty 1

## 2014-03-19 NOTE — Progress Notes (Signed)
Notified radiology and informed them that abdominal US needed to be done ASAP.  ABD [place] will be done in the morning per radiology.

## 2014-03-19 NOTE — Progress Notes (Signed)
Received call from radiology stating that patient had an order for a left leg soft tissue biopsy and that we do not do them at [facility] and it would need to be done at [facility] or [facility].  Notified MD.  Will continue to monitor.

## 2014-03-19 NOTE — Progress Notes (Addendum)
I have seen and examined this patient and I agree with the above assessment and plan.  Hypercalcemia - now corrected, persistent encephalopathic. PTH appropriately low. Acute encephalopathy - repeat urinalysis today, mild leukocytosis. Ammonia high ?etiology, no known liver cirrhosis other than [place] with diffuse increased echogenicity due to probable fatty infiltration. Rifaximin and monitor response.  Acute renal failure - stable Soft tissue masses - plan for biopsy, but lesions small and will do a dedicated US per radiology to see if they are amenable to biopsy. Dementia - has a degree of cognitive problems, problems with short term memory per daughter however her energy level is much better than currently. History of breast cancer - s/p resection, no chemo, has been on Tamoxifen for 5 years.  Leukocytosis - monitor, repeat UA  Time spent: 35 minutes  Marzetta Board, MD [facility] [phone]

## 2014-03-19 NOTE — Progress Notes (Signed)
[facility] PROGRESS NOTE  [name] LNL:[id number] DOB: 1917-10-08 DOA: 03/13/2014 PCP: Purvis Kilts, MD  Assessment/Plan: 1. Hypercalcemia, continues with symptoms of confusion, generalized weakness, lethargy, decreased appetite. Calcium level stable at 10.3 status post pamidronate 7/10. Suspect secondary to malignancy versus hyperparathyroidism. CT abdomen/pelvis and chest with anasarca and soft tissue nodularity. Prominent left lower quadrant abdominal subcutaneous soft tissue nodule and possible right lower lobe nodule. US guided biopsy today. 2. Acute renal failure. Stabile. IV fluids discontinued due to anasarca. Adequate urine output. 3. Acute encephalopathy. More alert this am. Able to make basic needs known.  Suspect related to hypercalcemia.  Refusing food today.  4. Elevated LFTs, stable. Ultrasound of the liver showed diffuse fatty infiltration which may partially explain findings. No abdominal pain. See #1.  5. Hyperkalemia secondary to acute renal failure. Resolved with hydration. 6. History of breast cancer treated with lumpectomy, no chemotherapy or radiation, perhaps 10 years ago. 7. Early dementia 8. Leukocytosis: mild. Afebrile. Ct chest with bilateral pleural effusions 03/18/14. Will obtain urinalysis.   Code Status: full Family Communication: caregiver at bedside Disposition Plan: to be determined   Consultants:  PT  Home health  Procedures:  none  Antibiotics:  none  HPI/Subjective: More alert this am. Pleasantly confused. Denies pain. Able to make basic needs known  Objective: Filed Vitals:   03/19/14 0517  BP: 126/45  Pulse: 87  Temp: 97.7 F (36.5 C)  Resp: 20    Intake/Output Summary (Last 24 hours) at 03/19/14 1016 Last data filed at 03/19/14 1002  Gross per 24 hour  Intake 2027.08 ml  Output    850 ml  Net 1177.08 ml   Filed Weights   03/13/14 1403  Weight: 67.132 kg (148 lb)    Exam:   General:  Thin  somewhat frail appearing  Cardiovascular: RRR +murmur no LE edema PPP  Respiratory: normal effort BS clear to auscultation bilaterally no wheeze  Abdomen: soft +BS non-tender to palpation  Musculoskeletal: no clubbing no cyanosis   Data Reviewed: Basic Metabolic Panel:  Recent Labs Lab 03/15/14 0503 03/16/14 0618 03/17/14 0530 03/18/14 0550 03/19/14 0551  NA 138 136* 140 140 139  K 4.6 4.1 3.9 3.9 3.7  CL 105 106 108 110 109  CO2 22 20 20 19  17*  GLUCOSE 95 90 81 74 74  BUN 43* 43* 43* 40* 39*  CREATININE 1.33* 1.29* 1.31* 1.30* 1.33*  CALCIUM 12.4* 12.2* 11.8* 10.8* 10.3   Liver Function Tests:  Recent Labs Lab 03/15/14 1006 03/16/14 0618 03/17/14 0530  AST 60* 48* 51*  ALT 41* 35 37*  ALKPHOS 310* 275* 290*  BILITOT 0.5 0.5 0.6  PROT 5.2* 4.5* 4.5*  ALBUMIN 2.4* 2.1* 2.2*    Recent Labs Lab 03/15/14 1159  LIPASE 40   No results found for this basename: AMMONIA,  in the last 168 hours CBC:  Recent Labs Lab 03/13/14 1445 03/14/14 0526 03/15/14 0503 03/19/14 0551  WBC 7.3 6.8 7.9 13.3*  HGB 10.6* 10.2* 11.3* 9.4*  HCT 31.0* 29.7* 33.0* 27.8*  MCV 81.6 81.4 81.1 83.0  PLT 202 191 190 PLATELET CLUMPS NOTED ON SMEAR, COUNT APPEARS ADEQUATE   Cardiac Enzymes:  Recent Labs Lab 03/13/14 1445  TROPONINI <0.30   BNP (last 3 results)  Recent Labs  03/13/14 1445  PROBNP 1434.0*   CBG: No results found for this basename: GLUCAP,  in the last 168 hours  Recent Results (from the past 240 hour(s))  URINE CULTURE  Status: None   Collection Time    03/13/14  4:45 PM      Result Value Ref Range Status   Specimen Description URINE, CLEAN CATCH   Final   Special Requests NONE   Final   Culture  Setup Time     Final   Value: 03/14/2014 04:28     Performed at [facility]     Final   Value: 60,000 COLONIES/ML     Performed at [facility]   Culture     Final   Value: Multiple bacterial morphotypes present, none  predominant. Suggest appropriate recollection if clinically indicated.     Performed at [facility]   Report Status 03/15/2014 FINAL   Final  MRSA PCR SCREENING     Status: None   Collection Time    03/13/14  7:30 PM      Result Value Ref Range Status   MRSA by PCR NEGATIVE  NEGATIVE Final   Comment:            The GeneXpert MRSA Assay (FDA     approved for NASAL specimens     only), is one component of a     comprehensive MRSA colonization     surveillance program. It is not     intended to diagnose MRSA     infection nor to guide or     monitor treatment for     MRSA infections.  CLOSTRIDIUM DIFFICILE BY PCR     Status: None   Collection Time    03/16/14  7:20 PM      Result Value Ref Range Status   C difficile by pcr NEGATIVE  NEGATIVE Final     Studies: Ct Abdomen Pelvis Wo Contrast  03/18/2014   CLINICAL DATA:  Anorexia, headache, nausea and vomiting. Elevated liver function tests. Post cholecystectomy.  EXAM: CT CHEST, ABDOMEN AND PELVIS WITHOUT CONTRAST  TECHNIQUE: Multidetector CT imaging of the chest, abdomen and pelvis was performed following the standard protocol without IV contrast.  COMPARISON:  Ultrasound abdomen 03/15/2014, CT abdomen/ pelvis 12/01/2012  FINDINGS: CT CHEST FINDINGS  Motion artifact degrades imaging. Small bilateral pleural effusions are noted with associated compressive atelectasis. Biapical pleural thickening noted. There is a 3 mm possible right lower lobe superior segment nodule image 32, not well seen due to motion.  Moderate atheromatous aortic calcification is noted. Coronary arterial calcification is present. Trace pericardial fluid. No mediastinal or hilar lymphadenopathy. There is a possible 2 cm mass within the subcutaneous fat adjacent to the left latissimus dorsi muscle, image 27.  CT ABDOMEN AND PELVIS FINDINGS  Small hiatal hernia noted with clips at the epigastrium. There is diffuse soft tissue anasarca as well as trace abdominal and  pelvic free fluid. Within the subcutaneous fat, there are multiple soft tissue nodules identified, for example left lower quadrant nodule 1.6 x 1.1 cm image 112. Trace subcutaneous gas likely indicates injection site. There is also a 1.6 cm nodule at the superior aspect of the left buttock. These are new since the prior exam.  No significant change in 1.8 cm low-density pancreatic tail lesion, allowing for motion. The kidneys are poorly evaluated due to motion, with right upper pole possible renal cortical cyst again identified measuring 1.6 cm and probable left mid renal cortical cyst measuring 1.5 cm, accounting for nodularity in these areas and previously seen cysts in these regions. Unenhanced liver, spleen, adrenal glands are unremarkable. Mild infrarenal abdominal aortic  ectasia is identified measuring 2.4 cm image 69. No lymphadenopathy is identified.  The bladder is decompressed but unremarkable. Uterus is normal in appearance. The ovaries are not discretely identified but there is no adnexal mass. Colonic diverticuli noted without evidence for diverticulitis. The appendix is not identified but there is no secondary evidence for acute appendicitis. Small pelvic free fluid is identified.  Disc degenerative change noted in the spine. No lytic or sclerotic osseous lesion. Remote left proximal humeral fracture is identified. L2 compression deformity is reidentified.  IMPRESSION: New diffuse anasarca and soft tissue nodularity. The presence of intra-abdominal/pelvic fluid with anasarca suggests third spacing or hypoproteinemia. However, the soft tissue nodule component is more suspicious for possible malignancy. Does the patient have a history of melanoma? Allowing for extensive motion artifact, and lack of IV contrast, no primary malignancy is identified in the chest, abdomen, or pelvis. A dominant left lower quadrant abdominal subcutaneous soft tissue nodule could be amenable to ultrasound-guided biopsy.  These  results will be called to the ordering clinician or representative by the Radiologist Assistant, and communication documented in the PACS or zVision Dashboard.   Electronically Signed   By: Conchita Paris M.D.   On: 03/18/2014 16:04   Ct Chest Wo Contrast  03/18/2014   CLINICAL DATA:  Anorexia, headache, nausea and vomiting. Elevated liver function tests. Post cholecystectomy.  EXAM: CT CHEST, ABDOMEN AND PELVIS WITHOUT CONTRAST  TECHNIQUE: Multidetector CT imaging of the chest, abdomen and pelvis was performed following the standard protocol without IV contrast.  COMPARISON:  Ultrasound abdomen 03/15/2014, CT abdomen/ pelvis 12/01/2012  FINDINGS: CT CHEST FINDINGS  Motion artifact degrades imaging. Small bilateral pleural effusions are noted with associated compressive atelectasis. Biapical pleural thickening noted. There is a 3 mm possible right lower lobe superior segment nodule image 32, not well seen due to motion.  Moderate atheromatous aortic calcification is noted. Coronary arterial calcification is present. Trace pericardial fluid. No mediastinal or hilar lymphadenopathy. There is a possible 2 cm mass within the subcutaneous fat adjacent to the left latissimus dorsi muscle, image 27.  CT ABDOMEN AND PELVIS FINDINGS  Small hiatal hernia noted with clips at the epigastrium. There is diffuse soft tissue anasarca as well as trace abdominal and pelvic free fluid. Within the subcutaneous fat, there are multiple soft tissue nodules identified, for example left lower quadrant nodule 1.6 x 1.1 cm image 112. Trace subcutaneous gas likely indicates injection site. There is also a 1.6 cm nodule at the superior aspect of the left buttock. These are new since the prior exam.  No significant change in 1.8 cm low-density pancreatic tail lesion, allowing for motion. The kidneys are poorly evaluated due to motion, with right upper pole possible renal cortical cyst again identified measuring 1.6 cm and probable left mid  renal cortical cyst measuring 1.5 cm, accounting for nodularity in these areas and previously seen cysts in these regions. Unenhanced liver, spleen, adrenal glands are unremarkable. Mild infrarenal abdominal aortic ectasia is identified measuring 2.4 cm image 69. No lymphadenopathy is identified.  The bladder is decompressed but unremarkable. Uterus is normal in appearance. The ovaries are not discretely identified but there is no adnexal mass. Colonic diverticuli noted without evidence for diverticulitis. The appendix is not identified but there is no secondary evidence for acute appendicitis. Small pelvic free fluid is identified.  Disc degenerative change noted in the spine. No lytic or sclerotic osseous lesion. Remote left proximal humeral fracture is identified. L2 compression deformity is reidentified.  IMPRESSION:  New diffuse anasarca and soft tissue nodularity. The presence of intra-abdominal/pelvic fluid with anasarca suggests third spacing or hypoproteinemia. However, the soft tissue nodule component is more suspicious for possible malignancy. Does the patient have a history of melanoma? Allowing for extensive motion artifact, and lack of IV contrast, no primary malignancy is identified in the chest, abdomen, or pelvis. A dominant left lower quadrant abdominal subcutaneous soft tissue nodule could be amenable to ultrasound-guided biopsy.  These results will be called to the ordering clinician or representative by the Radiologist Assistant, and communication documented in the PACS or zVision Dashboard.   Electronically Signed   By: Conchita Paris M.D.   On: 03/18/2014 16:04    Scheduled Meds: . amLODipine  5 mg Oral QHS  . donepezil  10 mg Oral QHS  . escitalopram  20 mg Oral Daily  . megestrol  800 mg Oral Daily  . pantoprazole  40 mg Oral Daily  . sodium chloride  3 mL Intravenous Q12H   Continuous Infusions:   Principal Problem:   Acute renal failure Active Problems:   Diarrhea   GERD  (gastroesophageal reflux disease)   Dehydration   Hypertension   Hyperlipidemia   Hyperkalemia   Abnormal CT of brain   Hypercalcemia   Acute encephalopathy   Elevated transaminase level    Time spent: 35 minutes    [facility] Pager [phone]. If 7PM-7AM, please contact night-coverage at [email or web address], password [facility] 03/19/2014, 10:16 AM  LOS: 6 days

## 2014-03-20 ENCOUNTER — Inpatient Hospital Stay (HOSPITAL_COMMUNITY): Payer: Medicare Other

## 2014-03-20 ENCOUNTER — Encounter (HOSPITAL_COMMUNITY): Payer: Self-pay | Admitting: Radiology

## 2014-03-20 LAB — VITAMIN D 1,25 DIHYDROXY
Vitamin D 1, 25 (OH)2 Total: 92 pg/mL — ABNORMAL HIGH (ref 18–72)
Vitamin D3 1, 25 (OH)2: 92 pg/mL

## 2014-03-20 LAB — BASIC METABOLIC PANEL
Anion gap: 12 (ref 5–15)
BUN: 42 mg/dL — ABNORMAL HIGH (ref 6–23)
CO2: 18 mEq/L — ABNORMAL LOW (ref 19–32)
CREATININE: 1.49 mg/dL — AB (ref 0.50–1.10)
Calcium: 10.3 mg/dL (ref 8.4–10.5)
Chloride: 109 mEq/L (ref 96–112)
GFR calc non Af Amer: 29 mL/min — ABNORMAL LOW (ref >90)
GFR, EST AFRICAN AMERICAN: 33 mL/min — AB (ref >90)
GLUCOSE: 76 mg/dL (ref 70–99)
Potassium: 3.6 mEq/L — ABNORMAL LOW (ref 3.7–5.3)
Sodium: 139 mEq/L (ref 137–147)

## 2014-03-20 LAB — CBC
HCT: 28 % — ABNORMAL LOW (ref 36.0–46.0)
HEMOGLOBIN: 9.4 g/dL — AB (ref 12.0–15.0)
MCH: 28 pg (ref 26.0–34.0)
MCHC: 33.6 g/dL (ref 30.0–36.0)
MCV: 83.3 fL (ref 78.0–100.0)
Platelets: ADEQUATE 10*3/uL (ref 150–400)
RBC: 3.36 MIL/uL — ABNORMAL LOW (ref 3.87–5.11)
RDW: 19.8 % — AB (ref 11.5–15.5)
WBC: 15.2 10*3/uL — ABNORMAL HIGH (ref 4.0–10.5)

## 2014-03-20 LAB — GLUCOSE, CAPILLARY: GLUCOSE-CAPILLARY: 88 mg/dL (ref 70–99)

## 2014-03-20 MED ORDER — DEXTROSE 5 % IV SOLN
1.0000 g | INTRAVENOUS | Status: DC
  Administered 2014-03-20 – 2014-03-22 (×3): 1 g via INTRAVENOUS
  Filled 2014-03-20 (×6): qty 10

## 2014-03-20 MED ORDER — POTASSIUM CHLORIDE CRYS ER 20 MEQ PO TBCR
40.0000 meq | EXTENDED_RELEASE_TABLET | Freq: Once | ORAL | Status: AC
  Administered 2014-03-20: 40 meq via ORAL
  Filled 2014-03-20: qty 2

## 2014-03-20 MED ORDER — BOOST / RESOURCE BREEZE PO LIQD
1.0000 | Freq: Three times a day (TID) | ORAL | Status: DC
  Administered 2014-03-20 – 2014-03-26 (×12): 1 via ORAL

## 2014-03-20 NOTE — Progress Notes (Addendum)
I have seen and examined this patient and I agree with the above assessment and plan.  Hypercalcemia - now corrected, persistent encephalopathic. PTH appropriately low.  Acute encephalopathy - UA worse yesterday, . Ammonia high ?etiology, no known liver cirrhosis other than [place] with diffuse increased echogenicity due to probable fatty infiltration. Rifaximin started 7/14, no real improvement.  Acute renal failure - stable  Possible UTI - Ceftriaxone. Cultures sent again.  Soft tissue masses - plan for biopsy, but lesions small and will do a dedicated US per radiology to see if they are amenable to biopsy.  Dementia - has a degree of cognitive problems, problems with short term memory per daughter however her energy level is much better than currently.  History of breast cancer - s/p resection, no chemo, has been on Tamoxifen for 5 years.  Leukocytosis - worsening, treat possible UTI.  Time spent: 25 min  Marzetta Board, MD [facility] [phone]

## 2014-03-20 NOTE — Progress Notes (Signed)
[facility] PROGRESS NOTE  [name] IWP:[id number] DOB: 09/25/1917 DOA: 03/13/2014 PCP: Purvis Kilts, MD  Assessment/Plan:  1. Hypercalcemia, continues with symptoms of confusion, generalized weakness, lethargy, decreased appetite. Calcium level stable at 10.3 status post pamidronate 7/10. Suspect secondary to malignancy. CT abdomen/pelvis and chest with anasarca and soft tissue nodularity. Prominent left lower quadrant abdominal subcutaneous soft tissue nodule and possible right lower lobe nodule. US guided biopsy postponed for soft tissue US to determine if nodules truly solid lesions per IR request.  2. Acute renal failure. Creatinine trending up. IV fluids discontinued 7/13 due to anasarca. Adequate urine output. monitor 3. Acute encephalopathy. Etiology uncertain i.e. Hypercalcemia vs infection vs liver disease. rifaxamin started 7/14. Rocephin started to day for possible UTI. No improvement today.   4. Elevated LFTs, stable. Ultrasound of the liver showed diffuse fatty infiltration which may partially explain findings. No abdominal pain. Mildly elevated ammonia level. Rifaximin started.    5. Hyperkalemia secondary to acute renal failure. Now slightly hypokalemic. Will repete and recheck. monitor 6. History of breast cancer treated with lumpectomy, no chemotherapy or radiation, perhaps 10 years ago. 7. Early dementia: short term memory issue. 8. Leukocytosis: trending upward. Max temp 99.3. Ct chest with bilateral pleural effusions 03/18/14. Urinalysis concerning for UTI. Rocephin started. Will monitor    Code Status: DNR Family Communication: caregiver at bedside Disposition Plan: to be determined. May need short term rehab   Consultants:  none  Procedures:  none  Antibiotics:  Rocephin 03/20/14>>  HPI/Subjective: Up in chair. Appears comfortable  Objective: Filed Vitals:   03/20/14 0655  BP: 166/49  Pulse: 96  Temp: 99 F (37.2 C)  Resp: 20     Intake/Output Summary (Last 24 hours) at 03/20/14 1047 Last data filed at 03/20/14 0911  Gross per 24 hour  Intake    360 ml  Output    700 ml  Net   -340 ml   Filed Weights   03/13/14 1403  Weight: 67.132 kg (148 lb)    Exam:   General:  Pale, somewhat ill appearing. NAD  Cardiovascular: RRR +Murmur No LE edema  Respiratory: normal effort BS distant but clear no wheeze  Abdomen: round, soft non-distended +BS   Musculoskeletal: no clubbing or cyanosis   Data Reviewed: Basic Metabolic Panel:  Recent Labs Lab 03/16/14 0618 03/17/14 0530 03/18/14 0550 03/19/14 0551 03/20/14 0613  NA 136* 140 140 139 139  K 4.1 3.9 3.9 3.7 3.6*  CL 106 108 110 109 109  CO2 20 20 19  17* 18*  GLUCOSE 90 81 74 74 76  BUN 43* 43* 40* 39* 42*  CREATININE 1.29* 1.31* 1.30* 1.33* 1.49*  CALCIUM 12.2* 11.8* 10.8* 10.3 10.3   Liver Function Tests:  Recent Labs Lab 03/15/14 1006 03/16/14 0618 03/17/14 0530 03/19/14 1139  AST 60* 48* 51* 55*  ALT 41* 35 37* 39*  ALKPHOS 310* 275* 290* 287*  BILITOT 0.5 0.5 0.6 0.8  PROT 5.2* 4.5* 4.5* 4.7*  ALBUMIN 2.4* 2.1* 2.2* 2.2*    Recent Labs Lab 03/15/14 1159  LIPASE 40    Recent Labs Lab 03/19/14 1139  AMMONIA 76*   CBC:  Recent Labs Lab 03/13/14 1445 03/14/14 0526 03/15/14 0503 03/19/14 0551 03/20/14 0613  WBC 7.3 6.8 7.9 13.3* 15.2*  HGB 10.6* 10.2* 11.3* 9.4* 9.4*  HCT 31.0* 29.7* 33.0* 27.8* 28.0*  MCV 81.6 81.4 81.1 83.0 83.3  PLT 202 191 190 PLATELET CLUMPS NOTED ON SMEAR, COUNT APPEARS ADEQUATE PLATELET  CLUMPS NOTED ON SMEAR, COUNT APPEARS ADEQUATE   Cardiac Enzymes:  Recent Labs Lab 03/13/14 1445  TROPONINI <0.30   BNP (last 3 results)  Recent Labs  03/13/14 1445  PROBNP 1434.0*   CBG: No results found for this basename: GLUCAP,  in the last 168 hours  Recent Results (from the past 240 hour(s))  URINE CULTURE     Status: None   Collection Time    03/13/14  4:45 PM      Result Value Ref  Range Status   Specimen Description URINE, CLEAN CATCH   Final   Special Requests NONE   Final   Culture  Setup Time     Final   Value: 03/14/2014 04:28     Performed at [facility]     Final   Value: 60,000 COLONIES/ML     Performed at [facility]   Culture     Final   Value: Multiple bacterial morphotypes present, none predominant. Suggest appropriate recollection if clinically indicated.     Performed at [facility]   Report Status 03/15/2014 FINAL   Final  MRSA PCR SCREENING     Status: None   Collection Time    03/13/14  7:30 PM      Result Value Ref Range Status   MRSA by PCR NEGATIVE  NEGATIVE Final   Comment:            The GeneXpert MRSA Assay (FDA     approved for NASAL specimens     only), is one component of a     comprehensive MRSA colonization     surveillance program. It is not     intended to diagnose MRSA     infection nor to guide or     monitor treatment for     MRSA infections.  CLOSTRIDIUM DIFFICILE BY PCR     Status: None   Collection Time    03/16/14  7:20 PM      Result Value Ref Range Status   C difficile by pcr NEGATIVE  NEGATIVE Final     Studies: Ct Abdomen Pelvis Wo Contrast  03/18/2014   CLINICAL DATA:  Anorexia, headache, nausea and vomiting. Elevated liver function tests. Post cholecystectomy.  EXAM: CT CHEST, ABDOMEN AND PELVIS WITHOUT CONTRAST  TECHNIQUE: Multidetector CT imaging of the chest, abdomen and pelvis was performed following the standard protocol without IV contrast.  COMPARISON:  Ultrasound abdomen 03/15/2014, CT abdomen/ pelvis 12/01/2012  FINDINGS: CT CHEST FINDINGS  Motion artifact degrades imaging. Small bilateral pleural effusions are noted with associated compressive atelectasis. Biapical pleural thickening noted. There is a 3 mm possible right lower lobe superior segment nodule image 32, not well seen due to motion.  Moderate atheromatous aortic calcification is noted. Coronary arterial  calcification is present. Trace pericardial fluid. No mediastinal or hilar lymphadenopathy. There is a possible 2 cm mass within the subcutaneous fat adjacent to the left latissimus dorsi muscle, image 27.  CT ABDOMEN AND PELVIS FINDINGS  Small hiatal hernia noted with clips at the epigastrium. There is diffuse soft tissue anasarca as well as trace abdominal and pelvic free fluid. Within the subcutaneous fat, there are multiple soft tissue nodules identified, for example left lower quadrant nodule 1.6 x 1.1 cm image 112. Trace subcutaneous gas likely indicates injection site. There is also a 1.6 cm nodule at the superior aspect of the left buttock. These are new since the prior exam.  No significant  change in 1.8 cm low-density pancreatic tail lesion, allowing for motion. The kidneys are poorly evaluated due to motion, with right upper pole possible renal cortical cyst again identified measuring 1.6 cm and probable left mid renal cortical cyst measuring 1.5 cm, accounting for nodularity in these areas and previously seen cysts in these regions. Unenhanced liver, spleen, adrenal glands are unremarkable. Mild infrarenal abdominal aortic ectasia is identified measuring 2.4 cm image 69. No lymphadenopathy is identified.  The bladder is decompressed but unremarkable. Uterus is normal in appearance. The ovaries are not discretely identified but there is no adnexal mass. Colonic diverticuli noted without evidence for diverticulitis. The appendix is not identified but there is no secondary evidence for acute appendicitis. Small pelvic free fluid is identified.  Disc degenerative change noted in the spine. No lytic or sclerotic osseous lesion. Remote left proximal humeral fracture is identified. L2 compression deformity is reidentified.  IMPRESSION: New diffuse anasarca and soft tissue nodularity. The presence of intra-abdominal/pelvic fluid with anasarca suggests third spacing or hypoproteinemia. However, the soft tissue  nodule component is more suspicious for possible malignancy. Does the patient have a history of melanoma? Allowing for extensive motion artifact, and lack of IV contrast, no primary malignancy is identified in the chest, abdomen, or pelvis. A dominant left lower quadrant abdominal subcutaneous soft tissue nodule could be amenable to ultrasound-guided biopsy.  These results will be called to the ordering clinician or representative by the Radiologist Assistant, and communication documented in the PACS or zVision Dashboard.   Electronically Signed   By: Conchita Paris M.D.   On: 03/18/2014 16:04   Ct Chest Wo Contrast  03/18/2014   CLINICAL DATA:  Anorexia, headache, nausea and vomiting. Elevated liver function tests. Post cholecystectomy.  EXAM: CT CHEST, ABDOMEN AND PELVIS WITHOUT CONTRAST  TECHNIQUE: Multidetector CT imaging of the chest, abdomen and pelvis was performed following the standard protocol without IV contrast.  COMPARISON:  Ultrasound abdomen 03/15/2014, CT abdomen/ pelvis 12/01/2012  FINDINGS: CT CHEST FINDINGS  Motion artifact degrades imaging. Small bilateral pleural effusions are noted with associated compressive atelectasis. Biapical pleural thickening noted. There is a 3 mm possible right lower lobe superior segment nodule image 32, not well seen due to motion.  Moderate atheromatous aortic calcification is noted. Coronary arterial calcification is present. Trace pericardial fluid. No mediastinal or hilar lymphadenopathy. There is a possible 2 cm mass within the subcutaneous fat adjacent to the left latissimus dorsi muscle, image 27.  CT ABDOMEN AND PELVIS FINDINGS  Small hiatal hernia noted with clips at the epigastrium. There is diffuse soft tissue anasarca as well as trace abdominal and pelvic free fluid. Within the subcutaneous fat, there are multiple soft tissue nodules identified, for example left lower quadrant nodule 1.6 x 1.1 cm image 112. Trace subcutaneous gas likely indicates  injection site. There is also a 1.6 cm nodule at the superior aspect of the left buttock. These are new since the prior exam.  No significant change in 1.8 cm low-density pancreatic tail lesion, allowing for motion. The kidneys are poorly evaluated due to motion, with right upper pole possible renal cortical cyst again identified measuring 1.6 cm and probable left mid renal cortical cyst measuring 1.5 cm, accounting for nodularity in these areas and previously seen cysts in these regions. Unenhanced liver, spleen, adrenal glands are unremarkable. Mild infrarenal abdominal aortic ectasia is identified measuring 2.4 cm image 69. No lymphadenopathy is identified.  The bladder is decompressed but unremarkable. Uterus is normal in appearance. The  ovaries are not discretely identified but there is no adnexal mass. Colonic diverticuli noted without evidence for diverticulitis. The appendix is not identified but there is no secondary evidence for acute appendicitis. Small pelvic free fluid is identified.  Disc degenerative change noted in the spine. No lytic or sclerotic osseous lesion. Remote left proximal humeral fracture is identified. L2 compression deformity is reidentified.  IMPRESSION: New diffuse anasarca and soft tissue nodularity. The presence of intra-abdominal/pelvic fluid with anasarca suggests third spacing or hypoproteinemia. However, the soft tissue nodule component is more suspicious for possible malignancy. Does the patient have a history of melanoma? Allowing for extensive motion artifact, and lack of IV contrast, no primary malignancy is identified in the chest, abdomen, or pelvis. A dominant left lower quadrant abdominal subcutaneous soft tissue nodule could be amenable to ultrasound-guided biopsy.  These results will be called to the ordering clinician or representative by the Radiologist Assistant, and communication documented in the PACS or zVision Dashboard.   Electronically Signed   By: Conchita Paris M.D.   On: 03/18/2014 16:04    Scheduled Meds: . amLODipine  5 mg Oral QHS  . cefTRIAXone (ROCEPHIN)  IV  1 g Intravenous Q24H  . donepezil  10 mg Oral QHS  . escitalopram  20 mg Oral Daily  . megestrol  800 mg Oral Daily  . pantoprazole  40 mg Oral Daily  . rifaximin  550 mg Oral BID  . sodium chloride  3 mL Intravenous Q12H   Continuous Infusions:   Principal Problem:   Acute renal failure Active Problems:   Diarrhea   GERD (gastroesophageal reflux disease)   Dehydration   Hypertension   Hyperlipidemia   Hyperkalemia   Abnormal CT of brain   Hypercalcemia   Acute encephalopathy   Elevated transaminase level    Time spent: 35 minutes    [facility] Pager [phone]. If 7PM-7AM, please contact night-coverage at [email or web address], password [facility] 03/20/2014, 10:47 AM  LOS: 7 days

## 2014-03-20 NOTE — Progress Notes (Signed)
Patient ID: [name], female   DOB: June 11, 1918, 80 y.o.   MRN: [id number]    Request received for biopsy of sub cutaneous abdominal nodules Dr Anselm Pancoast has reviewed imaging Rec: [place] for soft tissue specifically to determine if these nodules are truly sold lesions If so, could consider biopsy.  Discussed with Dyanne Carrel NP She has ordered US for today

## 2014-03-20 NOTE — Clinical Social Work Psychosocial (Signed)
Clinical Social Work Department BRIEF PSYCHOSOCIAL ASSESSMENT 03/20/2014  Patient:  [name], [name]     Account Number:  [id number]     Admit date:  03/13/2014  Clinical Social Worker:  Wyatt Haste  Date/Time:  03/20/2014 03:42 PM  Referred by:  CSW  Date Referred:  03/20/2014 Referred for  SNF Placement   Other Referral:   Interview type:  Patient Other interview type:   daughter- [name]    PSYCHOSOCIAL DATA Living Status:  ALONE Admitted from facility:   Level of care:   Primary support name:  [name] Primary support relationship to patient:  CHILD, ADULT Degree of support available:   supportive    CURRENT CONCERNS Current Concerns  Post-Acute Placement   Other Concerns:    SOCIAL WORK ASSESSMENT / PLAN CSW met with pt at bedside. Pt oriented to self and place only. Pt's caretakers from home also present and report that pt is very different from baseline. She generally is dressed and ready for the day, greeting her caretaker at the door when they arrive and ready to leave for breakfast. CSW called pt's daughter, [name] to complete assessment. She reports pt has caretakers from 10am-8pm and is alone overnight. She has a life alert in case needed. Pt generally does fine alone. She does not require a walker. [name] lives about 20 miles away, but appears to be very involved and supportive. [name] indicates caretakers assist pt with errands, medications, and meals. Initial PT evaluation several days ago recommended home health, but pt has required extensive assist per staff. She was re-evaluated today and SNF is recommended. CSW discussed placement process and [name] is agreeable. She reports pt has been to [facility] in the past and this is their preference again. Aware of Medicare coverage/criteria. SNF list left in room. [name] feels pt will have a hard time adjusting to SNF again, but feels it is necessary in order to transition to home eventually.   Assessment/plan status:   Psychosocial Support/Ongoing Assessment of Needs Other assessment/ plan:   Information/referral to community resources:   SNF list    PATIENT'S/FAMILY'S RESPONSE TO PLAN OF CARE: Pt's daughter reports positive feelings regarding [facility] for [facility]. CSW will initiate bed search and follow up with bed offers when available.       [name], [place]

## 2014-03-20 NOTE — Progress Notes (Signed)
Patient lying in bed resting quietly Sitter at bedside. No complaints voiced this shift.

## 2014-03-20 NOTE — Progress Notes (Signed)
INITIAL NUTRITION ASSESSMENT  INTERVENTION: Resource Breeze po TID, each supplement provides 250 kcal and 9 grams of protein   Obtain current weight for assessment of percent change  RD will follow nutrition care  NUTRITION DIAGNOSIS: Inadequate oral intake related to acute illness  as evidenced by meal intake data (0-25%) of most meals x 7 days.    Goal: Pt to meet >/= 90% of their estimated nutrition needs    Monitor:  Po intake, labs and wt trends   80 y.o. female  Admitting Dx: Acute renal failure  ASSESSMENT: RD drawn to pt due to length of stay. She had been eating well prior to admission per daughter but her intake past 7 days has been very poor. Today especially daughter reports only bites of food and sips of liquid.  Pt had abdominal US today (no mass or nodules found). UA yesterday and culture is pending. Acute encephalopathy, increased ammonia likely contributing to decreased appetite.  Daughters have continued to offer pt favorite foods from home and local restaurants. She take only a couple sips of Ensure and same with trial of Magic Cup. She is receiving Megace for appetite.  Will add [employer] and follow receptivity. Concern for increased dehydrated due to limited fluid intake. No IVF running at present.   Bed weight today shows severe increase (11 kg) since admission. Suggest obtain Standing weight if pt is able.  Suspect moderate to severe malnutrition but unable to confirm at this time.  Height: Ht Readings from Last 1 Encounters:  03/13/14 5' 5.5" (1.664 m)    Weight: Wt Readings from Last 1 Encounters:  03/13/14 148 lb (67.132 kg)    Ideal Body Weight: 125# (56.8 kg)  % Ideal Body Weight: 118%  Wt Readings from Last 10 Encounters:  03/13/14 148 lb (67.132 kg)  12/18/13 139 lb 14.4 oz (63.458 kg)  04/02/13 65 lb 8 oz (29.711 kg)  01/01/13 144 lb 8 oz (65.545 kg)  11/20/12 141 lb 6.4 oz (64.139 kg)  10/17/12 147 lb 4.8 oz (66.815 kg)   10/03/12 148 lb (67.132 kg)  03/07/12 152 lb (68.947 kg)  01/10/12 150 lb (68.04 kg)  03/25/08 183 lb (83.008 kg)    Usual Body Weight: 140-150#  % Usual Body Weight: 100%  BMI:  Body mass index is 24.25 kg/(m^2).normal range  Estimated Nutritional Needs: Kcal: 1425-1710 Protein: 70-80 gr Fluid: >1700 ml daily  Skin: intact  Diet Order: Cardiac  EDUCATION NEEDS: -No education needs identified at this time   Intake/Output Summary (Last 24 hours) at 03/20/14 1423 Last data filed at 03/20/14 0911  Gross per 24 hour  Intake    290 ml  Output    400 ml  Net   -110 ml    Last BM: 03/19/14  Labs:   Recent Labs Lab 03/18/14 0550 03/19/14 0551 03/20/14 0613  NA 140 139 139  K 3.9 3.7 3.6*  CL 110 109 109  CO2 19 17* 18*  BUN 40* 39* 42*  CREATININE 1.30* 1.33* 1.49*  CALCIUM 10.8* 10.3 10.3  GLUCOSE 74 74 76    CBG (last 3)   Recent Labs  03/20/14 1102  GLUCAP 88    Scheduled Meds: . amLODipine  5 mg Oral QHS  . cefTRIAXone (ROCEPHIN)  IV  1 g Intravenous Q24H  . donepezil  10 mg Oral QHS  . escitalopram  20 mg Oral Daily  . megestrol  800 mg Oral Daily  . pantoprazole  40 mg Oral  Daily  . rifaximin  550 mg Oral BID  . sodium chloride  3 mL Intravenous Q12H    Continuous Infusions:   Past Medical History  Diagnosis Date  . Lumbar facet arthropathy   . Primary localized osteoarthrosis, hand   . Lumbosacral spondylosis without myelopathy   . Pain in joint, lower leg   . Hypertension   . Hyperlipidemia   . Palpitation   . Aortic valve stenosis   . Murmur 04/2012    2D Echo EF 65-70%, she did have moderate aortic stenosis with a mean gradient of 15, maximum gradient 21, and calculated aortic valve area of 1.05 cm2 to 1.1 cm2, she did have moderate tricuspid regurgitation and moderate pulmonary hypentension with a PA pressure of 87mm, there was trace of mild MR.  . Breast cancer     Past Surgical History  Procedure Laterality Date  .  Abdominal hysterectomy    . Spine surgery    . Breast surgery    . Appendectomy  50 yrs ago  . Cholecystectomy    . Hiatal hernia repair    . Tonsillectomy  68yrs ago    Colman Cater MS,RD,CSG,LDN Office: #[phone] Pager: [phone]

## 2014-03-20 NOTE — Clinical Social Work Placement (Signed)
Clinical Social Work Department CLINICAL SOCIAL WORK PLACEMENT NOTE 03/20/2014  Patient:  ZAMARA, COZAD  Account Number:  [id number] Admit date:  03/13/2014  Clinical Social Worker:  [name], LCSW  Date/time:  03/20/2014 03:38 PM  Clinical Social Work is seeking post-discharge placement for this patient at the following level of care:   [place]   (*CSW will update this form in Epic as items are completed)   03/20/2014  Patient/family provided with [facility] Department of Clinical Social Work's list of facilities offering this level of care within the geographic area requested by the patient (or if unable, by the patient's family).  03/20/2014  Patient/family informed of their freedom to choose among providers that offer the needed level of care, that participate in Medicare, Medicaid or managed care program needed by the patient, have an available bed and are willing to accept the patient.  03/20/2014  Patient/family informed of [facility]' ownership interest in [facility], as well as of the fact that they are under no obligation to receive care at this facility.  PASARR submitted to EDS on  PASARR number received on   FL2 transmitted to all facilities in geographic area requested by pt/family on  03/20/2014 FL2 transmitted to all facilities within larger geographic area on   Patient informed that his/her managed care company has contracts with or will negotiate with  certain facilities, including the following:     Patient/family informed of bed offers received:   Patient chooses bed at  Physician recommends and patient chooses bed at    Patient to be transferred to  on   Patient to be transferred to facility by  Patient and family notified of transfer on  Name of family member notified:    The following physician request were entered in Epic:   Additional Comments: Pt has existing pasarr.  [name], [place]

## 2014-03-20 NOTE — Progress Notes (Signed)
Patient up to chair. Tolerating well. Confused to time and situation. Sitter at bedside.

## 2014-03-20 NOTE — Evaluation (Signed)
Physical Therapy Re-Evaluation Patient Details Name: [name] MRN: [id number] DOB: [birth year] Today's Date: 03/20/2014   History of Present Illness  IDALEE [name] is a 80 y.o. female with past medical history that includes hypertension, hyperlipidemia, aortic valve stenosis presents to the emergency department with chief complaint of weakness. Information is obtained from the patient and her daughter who is at the bedside. Initial workup in the emergency room reveals acute renal failure, hyperkalemia, hyponatremia, dehydration and anemia.  Daughter reports that patient fell 3 days ago. Patient reports mechanical fall and denies any loss of consciousness dizziness syncope or near-syncope.  EMS was called and recommended she come to the hospital but she refused Patient lives alone with caregivers but fall was not witnessed. The day after the fall she complained of some soreness and developed bruising on her back she maintained her mobility. The following day she became somewhat lethargic with intermittent confusion and not eating or drinking her normal amount. She was unable to bear weight and ambulates to the bathroom without assistance. She denies chest pain palpitation headache visual disturbances numbness or tingling of extremities. She denies fever chills abdominal pain nausea vomiting diarrhea or constipation. She denies dysuria hematuria frequency or urgency.    Clinical Impression  Orders received for re-evaluation as pt has had a decline in functional mobility skills in the past two days.  Family does report pt has not been eating; her appetite is usually good and she eats all foods, though not the pt states she just isn't hungry.  Noted treatment with PTA on 7/13, with min guard assist for amb in room for 16 feet.  Pt up in chair prior to PT entering room, and bed mobility not assessed.  Family/sitter present during evaluation with reports of assist x2 to stand pivot transfer to chair  secondary to lethargy and fatigue.  Attempted to question pt about current condition, however pt was very lethargic and falling asleep during questioning.  Therapeutic exercises initiated to try to wake pt up, however pt continued to fall asleep.  Sit <-> stand transfers assessed, with notable increase in assist required today compared to evaluation and treatment on 03/18/14.  Max VC for technique for pt to assist with transfers, and mod/max assist x1 to stand upright.  Pt able to stand upright for 10 seconds prior to requiring seated rest break, despite encouragement to attempt to stand longer.  Pt allowed a seated rest break, and transfer completed again to assess gait.  Pt again required mod/max assist to complete.  She was able to clear Rt foot, however unable to clear Lt foot.  No complaints of pain reported, only "i'm tired".  Noted heavy breathing at end of sit <-> stand transfer second time; O2 stats takes with O2 at 98%. Unable to assess gait today secondary to lethargy and fatigue.  Educated pt and family on importance of nutrition for energy for the body to complete tasks, and possibility of why pt was so tired; family reports they have been encouraging her to eat however she is refusing.  Recommend continued PT in the hospital to address activity tolerance, strengthening, balance for improved functional mobility skills, with new recommendation for SNF placement at discharge.  Case management informed of change in recommendation for SNF at discharge.      Follow Up Recommendations SNF    Equipment Recommendations  None recommended by PT       Precautions / Restrictions Precautions Precautions: Fall Restrictions Weight Bearing Restrictions: No  Mobility  Bed Mobility               General bed mobility comments: Not assessed as pt in chair prior to PT visit  Transfers Overall transfer level: Needs assistance Equipment used: Rolling walker (2 wheeled) Transfers: Sit to/from  Stand Sit to Stand: Mod assist;Max assist         General transfer comment: Stand pivot transfer performed with staff to chair, with reprots of assist x2 as pt unable to move feet (I)  Ambulation/Gait             General Gait Details: Unable to compete gait today.  Pt able to clear Rt foot from floor, though unable to clear Lt foot.  Pt only able to stand 10 seconds for two attempted.  Attempted to stand longer, however pt unable to tolerate secondary to fatigue.        Balance Overall balance assessment: History of Falls         Standing balance support: Bilateral upper extremity supported;During functional activity Standing balance-Leahy Scale: Fair                               Pertinent Vitals/Pain No pain reported.                    Communication      Cognition Arousal/Alertness: Lethargic Behavior During Therapy: WFL for tasks assessed/performed Overall Cognitive Status: Within Functional Limits for tasks assessed                         Exercises General Exercises - Lower Extremity Long Arc Quad: 5 reps;Both;Seated Hip Flexion/Marching: 5 reps;Both;Seated  ** Pt unable to complete additional exercises secondary to lethargy.        Assessment/Plan    PT Assessment Patient needs continued PT services  PT Diagnosis Difficulty walking;Generalized weakness   PT Problem List Decreased strength;Decreased cognition;Decreased activity tolerance;Decreased safety awareness;Decreased balance;Decreased mobility  PT Treatment Interventions Balance training;Gait training;Neuromuscular re-education;Functional mobility training;Therapeutic activities;Therapeutic exercise      Frequency Min 3X/week    End of Session Equipment Utilized During Treatment: Gait belt Activity Tolerance: Patient limited by lethargy;Patient limited by fatigue Patient left: in chair;with chair alarm set;with call bell/phone within reach;with family/visitor  present Nurse Communication: Other (comment) (To Case Management for change in D/C recommendation)         Time: 0973-5329 PT Time Calculation (min): 20 min   Charges:   PT Evaluation $PT Re-evaluation: 1 Procedure      Marisol Glazer 03/20/2014, 1:33 PM

## 2014-03-21 ENCOUNTER — Ambulatory Visit (HOSPITAL_COMMUNITY)
Admit: 2014-03-21 | Discharge: 2014-03-21 | Disposition: A | Discharge status: Home / Self Care | Payer: Medicare Other | Attending: Internal Medicine | Admitting: Internal Medicine

## 2014-03-21 LAB — URINE CULTURE

## 2014-03-21 LAB — PTH-RELATED PEPTIDE: PTH-related peptide: 12 pg/mL — ABNORMAL LOW (ref 14–27)

## 2014-03-21 LAB — PROTIME-INR
INR: 1.26 (ref 0.00–1.49)
Prothrombin Time: 15.8 seconds — ABNORMAL HIGH (ref 11.6–15.2)

## 2014-03-21 LAB — BASIC METABOLIC PANEL
Anion gap: 13 (ref 5–15)
BUN: 44 mg/dL — AB (ref 6–23)
CALCIUM: 10.2 mg/dL (ref 8.4–10.5)
CO2: 17 mEq/L — ABNORMAL LOW (ref 19–32)
Chloride: 107 mEq/L (ref 96–112)
Creatinine, Ser: 1.53 mg/dL — ABNORMAL HIGH (ref 0.50–1.10)
GFR calc non Af Amer: 28 mL/min — ABNORMAL LOW (ref >90)
GFR, EST AFRICAN AMERICAN: 32 mL/min — AB (ref >90)
GLUCOSE: 92 mg/dL (ref 70–99)
Potassium: 3.7 mEq/L (ref 3.7–5.3)
Sodium: 137 mEq/L (ref 137–147)

## 2014-03-21 LAB — CBC
HCT: 27.4 % — ABNORMAL LOW (ref 36.0–46.0)
HEMOGLOBIN: 9.3 g/dL — AB (ref 12.0–15.0)
MCH: 28.4 pg (ref 26.0–34.0)
MCHC: 33.9 g/dL (ref 30.0–36.0)
MCV: 83.8 fL (ref 78.0–100.0)
Platelets: 194 10*3/uL (ref 150–400)
RBC: 3.27 MIL/uL — ABNORMAL LOW (ref 3.87–5.11)
RDW: 19.9 % — ABNORMAL HIGH (ref 11.5–15.5)
WBC: 16 10*3/uL — ABNORMAL HIGH (ref 4.0–10.5)

## 2014-03-21 MED ORDER — SODIUM CHLORIDE 0.9 % IV BOLUS (SEPSIS)
500.0000 mL | Freq: Once | INTRAVENOUS | Status: AC
  Administered 2014-03-21: 500 mL via INTRAVENOUS

## 2014-03-21 MED ORDER — FENTANYL CITRATE 0.05 MG/ML IJ SOLN
INTRAMUSCULAR | Status: AC
  Filled 2014-03-21: qty 2

## 2014-03-21 MED ORDER — MIDAZOLAM HCL 2 MG/2ML IJ SOLN
INTRAMUSCULAR | Status: AC
  Filled 2014-03-21: qty 2

## 2014-03-21 NOTE — Clinical Social Work Note (Signed)
CSW presented bed offer at [facility] which was preference and daughter accepts. Possible d/c today after biopsy per MD. CSW notified [place] of above. Will continue to follow and assist with d/c.  [name], [place]

## 2014-03-21 NOTE — Progress Notes (Signed)
[facility] PROGRESS NOTE  [name] JJH:[id number] DOB: 10/15/1917 DOA: 03/13/2014 PCP: Purvis Kilts, MD  Assessment/Plan: 1. Hypercalcemia, continues with symptoms of confusion, generalized weakness, lethargy, decreased appetite. Calcium level stable at 10.3 status post pamidronate 7/10. Suspect secondary to malignancy. CT abdomen/pelvis and chest with anasarca and soft tissue nodularity. Prominent left lower quadrant abdominal subcutaneous soft tissue nodule and possible right lower lobe nodule. US guided biopsy for today. Follow results  2. Acute renal failure. Creatinine continues to trend up. Will bolus with 532mlsIV fluids.Adequate urine output. Hold nephrotoxins. follow 3. Acute encephalopathy. Etiology uncertain i.e. Hypercalcemia vs infection vs liver disease. rifaxamin started 7/14 without any improvement. Rocephin started 7/15 for possible UTI. No improvement.  4. Elevated LFTs, stable. Ultrasound of the liver showed diffuse fatty infiltration which may partially explain findings. No abdominal pain. Mildly elevated ammonia level. Rifaximin started.  5. Hyperkalemia secondary to acute renal failure. Now slightly hypokalemic. Will repete and recheck. monitor 6. History of breast cancer treated with lumpectomy, no chemotherapy or radiation, perhaps 10 years ago. 7. Early dementia: short term memory issue. 8. Leukocytosis: trending upward. Max temp 99.3. Ct chest with bilateral pleural effusions 03/18/14. Urinalysis concerning for UTI. Rocephin day #2. Check rectal temp.  Will monitor    Code Status: DNR Family Communication: debbie daughter by phone Disposition Plan: snf when bed ready   Consultants:  none  Procedures:  none  Antibiotics:  Rocephin 03/20/14>>>  HPI/Subjective: Lying in bed eyes closed. Opens to gentle pain. Does not follow command  Objective: Filed Vitals:   03/21/14 0518  BP: 126/34  Pulse: 96  Temp: 99.2 F (37.3 C)  Resp: 14     Intake/Output Summary (Last 24 hours) at 03/21/14 0846 Last data filed at 03/21/14 0600  Gross per 24 hour  Intake    410 ml  Output   1150 ml  Net   -740 ml   Filed Weights   03/13/14 1403  Weight: 67.132 kg (148 lb)    Exam:   General:  Lethargic, well nourished NAD  Cardiovascular: RRR +murmur no LE edema  Respiratory: normal effort BS somewhat coarse. Faint wheeze  Abdomen: soft +BS non-distended non-tender  Musculoskeletal: joints without swelling/erythema   Data Reviewed: Basic Metabolic Panel:  Recent Labs Lab 03/17/14 0530 03/18/14 0550 03/19/14 0551 03/20/14 0613 03/21/14 0518  NA 140 140 139 139 137  K 3.9 3.9 3.7 3.6* 3.7  CL 108 110 109 109 107  CO2 20 19 17* 18* 17*  GLUCOSE 81 74 74 76 92  BUN 43* 40* 39* 42* 44*  CREATININE 1.31* 1.30* 1.33* 1.49* 1.53*  CALCIUM 11.8* 10.8* 10.3 10.3 10.2   Liver Function Tests:  Recent Labs Lab 03/15/14 1006 03/16/14 0618 03/17/14 0530 03/19/14 1139  AST 60* 48* 51* 55*  ALT 41* 35 37* 39*  ALKPHOS 310* 275* 290* 287*  BILITOT 0.5 0.5 0.6 0.8  PROT 5.2* 4.5* 4.5* 4.7*  ALBUMIN 2.4* 2.1* 2.2* 2.2*    Recent Labs Lab 03/15/14 1159  LIPASE 40    Recent Labs Lab 03/19/14 1139  AMMONIA 76*   CBC:  Recent Labs Lab 03/15/14 0503 03/19/14 0551 03/20/14 0613 03/21/14 0518  WBC 7.9 13.3* 15.2* 16.0*  HGB 11.3* 9.4* 9.4* 9.3*  HCT 33.0* 27.8* 28.0* 27.4*  MCV 81.1 83.0 83.3 83.8  PLT 190 PLATELET CLUMPS NOTED ON SMEAR, COUNT APPEARS ADEQUATE PLATELET CLUMPS NOTED ON SMEAR, COUNT APPEARS ADEQUATE 194   Cardiac Enzymes: No results found for  this basename: CKTOTAL, CKMB, CKMBINDEX, TROPONINI,  in the last 168 hours BNP (last 3 results)  Recent Labs  03/13/14 1445  PROBNP 1434.0*   CBG:  Recent Labs Lab 03/20/14 1102  GLUCAP 88    Recent Results (from the past 240 hour(s))  URINE CULTURE     Status: None   Collection Time    03/13/14  4:45 PM      Result Value Ref Range  Status   Specimen Description URINE, CLEAN CATCH   Final   Special Requests NONE   Final   Culture  Setup Time     Final   Value: 03/14/2014 04:28     Performed at [facility]     Final   Value: 60,000 COLONIES/ML     Performed at [facility]   Culture     Final   Value: Multiple bacterial morphotypes present, none predominant. Suggest appropriate recollection if clinically indicated.     Performed at [facility]   Report Status 03/15/2014 FINAL   Final  MRSA PCR SCREENING     Status: None   Collection Time    03/13/14  7:30 PM      Result Value Ref Range Status   MRSA by PCR NEGATIVE  NEGATIVE Final   Comment:            The GeneXpert MRSA Assay (FDA     approved for NASAL specimens     only), is one component of a     comprehensive MRSA colonization     surveillance program. It is not     intended to diagnose MRSA     infection nor to guide or     monitor treatment for     MRSA infections.  CLOSTRIDIUM DIFFICILE BY PCR     Status: None   Collection Time    03/16/14  7:20 PM      Result Value Ref Range Status   C difficile by pcr NEGATIVE  NEGATIVE Final     Studies: Mr Brain Wo Contrast  03/20/2014   CLINICAL DATA:  Gross cancer. Possible stroke. Mental status changes.  EXAM: MRI HEAD WITHOUT CONTRAST  TECHNIQUE: Multiplanar, multiecho pulse sequences of the brain and surrounding structures were obtained without intravenous contrast.  COMPARISON:  Head CT 03/16/2014  FINDINGS: The study Sever's from considerable motion degradation. No acute infarction is demonstrated. 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 There is old infarction in the right external capsule region and radiating white matter tracts. There is chronic small-vessel ischemic change throughout the hemispheric white matter. There is an old right parietal cortical infarction. No mass lesion, hemorrhage, hydrocephalus or extra-axial collection. No pituitary mass.  No skull base lesion seen.  IMPRESSION: Significant motion degradation. No sign of acute or reversible process. Old ischemic changes throughout the brain as outlined above.   Electronically Signed   By: Nelson Chimes M.D.   On: 03/20/2014 12:50   US Abdomen Limited  03/20/2014   ADDENDUM REPORT: 03/20/2014 17:22  ADDENDUM: Additional images were obtained in the left pelvic subcutaneous tissues. There is a poorly defined heterogeneous nodular structure in the left iliac crest region that roughly measures 1.5 x 0.7 x 1.6 cm. There is another small hypoechoic nodular structure that measures up to 0.7 cm. There is an oval-shaped hyperechoic area that measures up to 1.1 cm and may represent fat. There are additional ill-defined hypoechoic areas.  Impression: Scattered heterogeneous  nodular structures in the subcutaneous tissues. Findings are similar to the recent CT exam from the 03/18/2014. These ill-defined subcutaneous structures are nonspecific.   Electronically Signed   By: Markus Daft M.D.   On: 03/20/2014 17:22   03/20/2014   CLINICAL DATA:  Possible nodules on left lower quadrant.  EXAM: LIMITED ABDOMINAL ULTRASOUND  COMPARISON:  CT scan of March 18, 2014.  FINDINGS: Limited sonographic evaluation was performed of the left lower quadrant of the abdomen. No definite nodules, masses or fluid collection were identified in this area.  IMPRESSION: No definite mass or nodules are identified in the left lower quadrant of the abdomen by ultrasound.  Electronically Signed: By: Sabino Dick M.D. On: 03/20/2014 13:29    Scheduled Meds: . amLODipine  5 mg Oral QHS  . cefTRIAXone (ROCEPHIN)  IV  1 g Intravenous Q24H  . donepezil  10 mg Oral QHS  . escitalopram  20 mg Oral Daily  . feeding supplement (RESOURCE BREEZE)  1 Container Oral TID BM  . megestrol  800 mg Oral Daily  . pantoprazole  40 mg Oral Daily  . rifaximin  550 mg Oral BID  . sodium chloride  3 mL Intravenous Q12H   Continuous Infusions:    Principal Problem:   Acute renal failure Active Problems:   Diarrhea   GERD (gastroesophageal reflux disease)   Dehydration   Hypertension   Hyperlipidemia   Hyperkalemia   Abnormal CT of brain   Hypercalcemia   Acute encephalopathy   Elevated transaminase level    Time spent: 35 minutes    [facility] Pager [phone]. If 7PM-7AM, please contact night-coverage at [email or web address], password [facility] 03/21/2014, 8:46 AM  LOS: 8 days

## 2014-03-21 NOTE — Progress Notes (Signed)
I have seen and examined this patient and I agree with the above assessment and plan.  Hypercalcemia - now corrected, persistent encephalopathic. She is not improving. Acute encephalopathy - UA worse 7/14 . Ammonia high ?etiology, no known liver cirrhosis other than [place] with diffuse increased echogenicity due to probable fatty infiltration. Rifaximin started 7/14, no real improvement. Ceftriaxone started yesterday, no improvement. I suspect that her mental status is somewhat multifactorial on a degree of dementia with a rapid decline rather than an acute precipitating event.  Acute renal failure - stable, small bolus today  Possible UTI - Ceftriaxone. Cultures sent again.  Soft tissue masses - plan for biopsy today. Dementia - has a degree of cognitive problems, problems with short term memory per daughter however her energy level is much better than currently.  History of breast cancer - s/p resection, no chemo, has been on Tamoxifen for 5 years.  Leukocytosis - worsening, treat possible UTI.   Marzetta Board, MD [facility] [phone]

## 2014-03-21 NOTE — Progress Notes (Signed)
Patient ID: [name], female   DOB: 1918/04/03, 80 y.o.   MRN: [id number]   Pt now scheduled for US guided bx of abd subcutaneous nodule To be performed at [facility] rad today  See orders  Pt to be at [facility] by 12 noon today via ambulance

## 2014-03-21 NOTE — Clinical Social Work Note (Signed)
CSW received call from [place] at  [facility], wanted information on discharge, MD states discharge will likely be tomorrow.  Edwyna Shell, LCSW Clinical Social Worker [phone])

## 2014-03-21 NOTE — Clinical Social Work Placement (Signed)
Clinical Social Work Department CLINICAL SOCIAL WORK PLACEMENT NOTE 03/21/2014  Patient:  [name], [name]  Account Number:  [id number] Admit date:  03/13/2014  Clinical Social Worker:  [name], LCSW  Date/time:  03/20/2014 03:38 PM  Clinical Social Work is seeking post-discharge placement for this patient at the following level of care:   [place]   (*CSW will update this form in Epic as items are completed)   03/20/2014  Patient/family provided with [place] Department of Clinical Social Work's list of facilities offering this level of care within the geographic area requested by the patient (or if unable, by the patient's family).  03/20/2014  Patient/family informed of their freedom to choose among providers that offer the needed level of care, that participate in Medicare, Medicaid or managed care program needed by the patient, have an available bed and are willing to accept the patient.  03/20/2014  Patient/family informed of [facility]' ownership interest in [facility], as well as of the fact that they are under no obligation to receive care at this facility.  PASARR submitted to EDS on  PASARR number received on   FL2 transmitted to all facilities in geographic area requested by pt/family on  03/20/2014 FL2 transmitted to all facilities within larger geographic area on   Patient informed that his/her managed care company has contracts with or will negotiate with  certain facilities, including the following:     Patient/family informed of bed offers received:  03/21/2014 Patient chooses bed at [facility] Physician recommends and patient chooses bed at  [facility]  Patient to be transferred to  on   Patient to be transferred to facility by  Patient and family notified of transfer on  Name of family member notified:    The following physician request were entered in Epic:   Additional Comments: Pt has existing  pasarr.  [name], [place]

## 2014-03-21 NOTE — Sedation Documentation (Signed)
Pt voided 10 cc yellow urine

## 2014-03-21 NOTE — Progress Notes (Signed)
PT Cancellation Note  Patient Details Name: [name] MRN: [id number] DOB: 19-Apr-1918   Cancelled Treatment:    Reason Eval/Treat Not Completed: Patient at procedure or test/unavailable  Pt transported to [facility] for [place] via EMS.  Will re-attempt PT treatment as able.     Deshay Blumenfeld 03/21/2014, 11:31 AM

## 2014-03-21 NOTE — H&P (Signed)
[name] is an 80 y.o. female.   Chief Complaint: Admitted to [facility] with dehydration; AMS Work up reveals hypercalcemia and Abdominal sub cutaneous nodules on [place] Hx breast Ca [facility] has requested consult for biopsy Imaging has been reviewed by Dr Anselm Pancoast Pt has been seen and examined Now scheduled for same Consented dtr via phone  HPI: HTN; HLD: Ao Valve stenosis; Breast Ca  Past Medical History  Diagnosis Date  . Lumbar facet arthropathy   . Primary localized osteoarthrosis, hand   . Lumbosacral spondylosis without myelopathy   . Pain in joint, lower leg   . Hypertension   . Hyperlipidemia   . Palpitation   . Aortic valve stenosis   . Murmur 04/2012    2D Echo EF 65-70%, she did have moderate aortic stenosis with a mean gradient of 15, maximum gradient 21, and calculated aortic valve area of 1.05 cm2 to 1.1 cm2, she did have moderate tricuspid regurgitation and moderate pulmonary hypentension with a PA pressure of 55m, there was trace of mild MR.  . Breast cancer     Past Surgical History  Procedure Laterality Date  . Abdominal hysterectomy    . Spine surgery    . Breast surgery    . Appendectomy  50 yrs ago  . Cholecystectomy    . Hiatal hernia repair    . Tonsillectomy  549yrago    Family History  Problem Relation Age of Onset  . Cancer Father   . Stroke Sister   . Cancer Sister   . Multiple sclerosis Brother    Social History:  reports that she has never smoked. She has never used smokeless tobacco. She reports that she does not drink alcohol or use illicit drugs.  Allergies:  Allergies  Allergen Reactions  . Metoclopramide Hcl     Medications Prior to Admission  Medication Sig Dispense Refill  . acetaminophen (TYLENOL) 650 MG CR tablet Take 1,300 mg by mouth 3 (three) times daily.      . Marland KitchenLPRAZolam (XANAX) 0.25 MG tablet Take 0.25 mg by mouth daily after lunch.       . Marland KitchenmLODipine (NORVASC) 10 MG tablet Take 10 mg by mouth at bedtime.      .  donepezil (ARICEPT) 10 MG tablet Take 10 mg by mouth at bedtime.       . Marland Kitchenscitalopram (LEXAPRO) 10 MG tablet Take 20 mg by mouth daily.       . Marland Kitchensomeprazole (NEXIUM) 20 MG capsule Take 20 mg by mouth daily at 12 noon.      . ferrous sulfate dried (SLOW FE) 160 (50 FE) MG TBCR SR tablet Take 160 mg by mouth daily.      . Marland Kitchenoperamide (IMODIUM) 2 MG capsule Take 1 capsule (2 mg total) by mouth daily.  30 capsule  0  . Melatonin 2.5 MG CAPS Take 10 mg by mouth at bedtime.       . Vladimir Fasterlycol-Propyl Glycol (SYSTANE OP) Place 2 drops into both eyes daily.      . Probiotic Product (PROBIOTIC DAILY PO) Take by mouth daily.      . Probiotic Product (PROBIOTIC DAILY PO) Take 1 tablet by mouth daily.      . ramipril (ALTACE) 5 MG capsule Take 5 mg by mouth daily.      . vitamin B-12 (CYANOCOBALAMIN) 1000 MCG tablet Take 1,000 mcg by mouth daily.         Results for orders placed during the hospital encounter  of 03/13/14 (from the past 48 hour(s))  URINALYSIS, ROUTINE W REFLEX MICROSCOPIC     Status: Abnormal   Collection Time    03/19/14  5:15 PM      Result Value Ref Range   Color, Urine YELLOW  YELLOW   APPearance CLEAR  CLEAR   Specific Gravity, Urine 1.020  1.005 - 1.030   pH 5.5  5.0 - 8.0   Glucose, UA NEGATIVE  NEGATIVE mg/dL   Hgb urine dipstick NEGATIVE  NEGATIVE   Bilirubin Urine SMALL (*) NEGATIVE   Ketones, ur NEGATIVE  NEGATIVE mg/dL   Protein, ur TRACE (*) NEGATIVE mg/dL   Urobilinogen, UA 0.2  0.0 - 1.0 mg/dL   Nitrite NEGATIVE  NEGATIVE   Leukocytes, UA TRACE (*) NEGATIVE  URINE MICROSCOPIC-ADD ON     Status: Abnormal   Collection Time    03/19/14  5:15 PM      Result Value Ref Range   Squamous Epithelial / LPF FEW (*) RARE   WBC, UA 7-10  <3 WBC/hpf   Bacteria, UA FEW (*) RARE  URINE CULTURE     Status: None   Collection Time    03/19/14  5:15 PM      Result Value Ref Range   Specimen Description URINE, CLEAN CATCH     Special Requests NONE     Culture  Setup Time        Value: 03/20/2014 14:28     Performed at [facility]       Value: >=100,000 COLONIES/ML     Performed at [facility]   Culture       Value: Multiple bacterial morphotypes present, none predominant. Suggest appropriate recollection if clinically indicated.     Performed at [facility]   Report Status 03/21/2014 FINAL    CBC     Status: Abnormal   Collection Time    03/20/14  6:13 AM      Result Value Ref Range   WBC 15.2 (*) 4.0 - 10.5 K/uL   RBC 3.36 (*) 3.87 - 5.11 MIL/uL   Hemoglobin 9.4 (*) 12.0 - 15.0 g/dL   HCT 28.0 (*) 36.0 - 46.0 %   MCV 83.3  78.0 - 100.0 fL   MCH 28.0  26.0 - 34.0 pg   MCHC 33.6  30.0 - 36.0 g/dL   RDW 19.8 (*) 11.5 - 15.5 %   Platelets    150 - 400 K/uL   Value: PLATELET CLUMPS NOTED ON SMEAR, COUNT APPEARS ADEQUATE   Comment: PLATELET COUNT CONFIRMED BY SMEAR     LARGE PLATELETS PRESENT     SPECIMEN CHECKED FOR CLOTS  BASIC METABOLIC PANEL     Status: Abnormal   Collection Time    03/20/14  6:13 AM      Result Value Ref Range   Sodium 139  137 - 147 mEq/L   Potassium 3.6 (*) 3.7 - 5.3 mEq/L   Chloride 109  96 - 112 mEq/L   CO2 18 (*) 19 - 32 mEq/L   Glucose, Bld 76  70 - 99 mg/dL   BUN 42 (*) 6 - 23 mg/dL   Creatinine, Ser 1.49 (*) 0.50 - 1.10 mg/dL   Calcium 10.3  8.4 - 10.5 mg/dL   GFR calc non Af Amer 29 (*) >90 mL/min   GFR calc Af Amer 33 (*) >90 mL/min   Comment: (NOTE)     The eGFR has been calculated using  the CKD EPI equation.     This calculation has not been validated in all clinical situations.     eGFR's persistently <90 mL/min signify possible Chronic Kidney     Disease.   Anion gap 12  5 - 15  GLUCOSE, CAPILLARY     Status: None   Collection Time    03/20/14 11:02 AM      Result Value Ref Range   Glucose-Capillary 88  70 - 99 mg/dL  CBC     Status: Abnormal   Collection Time    03/21/14  5:18 AM      Result Value Ref Range   WBC 16.0 (*) 4.0 - 10.5 K/uL   RBC 3.27 (*)  3.87 - 5.11 MIL/uL   Hemoglobin 9.3 (*) 12.0 - 15.0 g/dL   HCT 27.4 (*) 36.0 - 46.0 %   MCV 83.8  78.0 - 100.0 fL   MCH 28.4  26.0 - 34.0 pg   MCHC 33.9  30.0 - 36.0 g/dL   RDW 19.9 (*) 11.5 - 15.5 %   Platelets 194  150 - 400 K/uL   Comment: SPECIMEN CHECKED FOR CLOTS     CONSISTENT WITH PREVIOUS RESULT  BASIC METABOLIC PANEL     Status: Abnormal   Collection Time    03/21/14  5:18 AM      Result Value Ref Range   Sodium 137  137 - 147 mEq/L   Potassium 3.7  3.7 - 5.3 mEq/L   Chloride 107  96 - 112 mEq/L   CO2 17 (*) 19 - 32 mEq/L   Glucose, Bld 92  70 - 99 mg/dL   BUN 44 (*) 6 - 23 mg/dL   Creatinine, Ser 1.53 (*) 0.50 - 1.10 mg/dL   Calcium 10.2  8.4 - 10.5 mg/dL   GFR calc non Af Amer 28 (*) >90 mL/min   GFR calc Af Amer 32 (*) >90 mL/min   Comment: (NOTE)     The eGFR has been calculated using the CKD EPI equation.     This calculation has not been validated in all clinical situations.     eGFR's persistently <90 mL/min signify possible Chronic Kidney     Disease.   Anion gap 13  5 - 15  PROTIME-INR     Status: Abnormal   Collection Time    03/21/14  9:05 AM      Result Value Ref Range   Prothrombin Time 15.8 (*) 11.6 - 15.2 seconds   INR 1.26  0.00 - 1.49   Mr Brain Wo Contrast  03/20/2014   CLINICAL DATA:  Gross cancer. Possible stroke. Mental status changes.  EXAM: MRI HEAD WITHOUT CONTRAST  TECHNIQUE: Multiplanar, multiecho pulse sequences of the brain and surrounding structures were obtained without intravenous contrast.  COMPARISON:  Head CT 03/16/2014  FINDINGS: The study Sever's from considerable motion degradation. No acute infarction is demonstrated. 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 There is old infarction in the right external capsule region and radiating white matter tracts. There is chronic small-vessel ischemic change throughout the hemispheric white matter. There is an old right parietal cortical infarction. No mass lesion, hemorrhage,  hydrocephalus or extra-axial collection. No pituitary mass. No skull base lesion seen.  IMPRESSION: Significant motion degradation. No sign of acute or reversible process. Old ischemic changes throughout the brain as outlined above.   Electronically Signed   By: Nelson Chimes M.D.   On: 03/20/2014 12:50   US Abdomen Limited  03/20/2014   ADDENDUM REPORT: 03/20/2014 17:22  ADDENDUM: Additional images were obtained in the left pelvic subcutaneous tissues. There is a poorly defined heterogeneous nodular structure in the left iliac crest region that roughly measures 1.5 x 0.7 x 1.6 cm. There is another small hypoechoic nodular structure that measures up to 0.7 cm. There is an oval-shaped hyperechoic area that measures up to 1.1 cm and may represent fat. There are additional ill-defined hypoechoic areas.  Impression: Scattered heterogeneous nodular structures in the subcutaneous tissues. Findings are similar to the recent CT exam from the 03/18/2014. These ill-defined subcutaneous structures are nonspecific.   Electronically Signed   By: Markus Daft M.D.   On: 03/20/2014 17:22   03/20/2014   CLINICAL DATA:  Possible nodules on left lower quadrant.  EXAM: LIMITED ABDOMINAL ULTRASOUND  COMPARISON:  CT scan of March 18, 2014.  FINDINGS: Limited sonographic evaluation was performed of the left lower quadrant of the abdomen. No definite nodules, masses or fluid collection were identified in this area.  IMPRESSION: No definite mass or nodules are identified in the left lower quadrant of the abdomen by ultrasound.  Electronically Signed: By: Sabino Dick M.D. On: 03/20/2014 13:29    Review of Systems  Constitutional: Positive for fever and weight loss.  HENT: Positive for hearing loss.   Respiratory: Negative for shortness of breath.   Cardiovascular: Negative for chest pain.  Gastrointestinal: Positive for nausea, vomiting, abdominal pain and diarrhea.  Neurological: Positive for weakness.  Psychiatric/Behavioral:  Positive for memory loss.    Blood pressure 126/34, pulse 96, temperature 99.8 F (37.7 C), temperature source Rectal, resp. rate 14, height 5' 5.5" (1.664 m), weight 67.132 kg (148 lb), SpO2 95.00%. Physical Exam  Cardiovascular: Normal rate.   Murmur heard. Respiratory: Effort normal. She has no wheezes.  GI: Soft. Bowel sounds are normal. There is no tenderness.  Musculoskeletal: Normal range of motion.  Seems to be responsive; confusion  Neurological:  encephalopathy  Skin: Skin is warm and dry.  Psychiatric:  Consented dtr Debbi via phone     Assessment/Plan Abd subcu nodules Hx breast ca Now scheduled for biopsy Pts dtr aware of procedure benefits and risks and agreeable to proceed Consent signed and in chart   Marybelle Giraldo A 03/21/2014, 12:30 PM

## 2014-03-21 NOTE — Procedures (Signed)
Questionable subcutaneous nodule in left lateral hip region.  5 core biopsies obtained from this area.  Normal appearing lymph nodes in the left anterior subcutaneous tissue with diffuse subcutaneous edema.  No immediate complication.

## 2014-03-22 ENCOUNTER — Inpatient Hospital Stay (HOSPITAL_COMMUNITY): Payer: Medicare Other

## 2014-03-22 LAB — BLOOD GAS, ARTERIAL
ACID-BASE DEFICIT: 7.6 mmol/L — AB (ref 0.0–2.0)
BICARBONATE: 16.5 meq/L — AB (ref 20.0–24.0)
FIO2: 21 %
O2 SAT: 93.8 %
Patient temperature: 37
TCO2: 15.5 mmol/L (ref 0–100)
pCO2 arterial: 28.3 mmHg — ABNORMAL LOW (ref 35.0–45.0)
pH, Arterial: 7.384 (ref 7.350–7.450)
pO2, Arterial: 72.8 mmHg — ABNORMAL LOW (ref 80.0–100.0)

## 2014-03-22 LAB — CBC
HCT: 28.8 % — ABNORMAL LOW (ref 36.0–46.0)
Hemoglobin: 9.5 g/dL — ABNORMAL LOW (ref 12.0–15.0)
MCH: 28.1 pg (ref 26.0–34.0)
MCHC: 33 g/dL (ref 30.0–36.0)
MCV: 85.2 fL (ref 78.0–100.0)
Platelets: 205 10*3/uL (ref 150–400)
RBC: 3.38 MIL/uL — ABNORMAL LOW (ref 3.87–5.11)
RDW: 21.1 % — AB (ref 11.5–15.5)
WBC: 18 10*3/uL — ABNORMAL HIGH (ref 4.0–10.5)

## 2014-03-22 LAB — BASIC METABOLIC PANEL
Anion gap: 14 (ref 5–15)
BUN: 46 mg/dL — ABNORMAL HIGH (ref 6–23)
CALCIUM: 10.1 mg/dL (ref 8.4–10.5)
CO2: 16 mEq/L — ABNORMAL LOW (ref 19–32)
Chloride: 109 mEq/L (ref 96–112)
Creatinine, Ser: 1.58 mg/dL — ABNORMAL HIGH (ref 0.50–1.10)
GFR calc Af Amer: 31 mL/min — ABNORMAL LOW (ref >90)
GFR, EST NON AFRICAN AMERICAN: 27 mL/min — AB (ref >90)
Glucose, Bld: 95 mg/dL (ref 70–99)
Potassium: 3.9 mEq/L (ref 3.7–5.3)
SODIUM: 139 meq/L (ref 137–147)

## 2014-03-22 LAB — CLOSTRIDIUM DIFFICILE BY PCR: CDIFFPCR: NEGATIVE

## 2014-03-22 MED ORDER — LORAZEPAM 0.5 MG PO TABS
0.5000 mg | ORAL_TABLET | Freq: Three times a day (TID) | ORAL | Status: DC | PRN
  Administered 2014-03-22 – 2014-03-26 (×4): 0.5 mg via ORAL
  Filled 2014-03-22 (×4): qty 1

## 2014-03-22 MED ORDER — PREDNISONE 20 MG PO TABS: 50.0000 mg | ORAL_TABLET | Freq: Every day | ORAL | Status: DC

## 2014-03-22 MED ORDER — FUROSEMIDE 10 MG/ML IJ SOLN
40.0000 mg | Freq: Once | INTRAMUSCULAR | Status: AC
  Administered 2014-03-22: 40 mg via INTRAVENOUS
  Filled 2014-03-22: qty 4

## 2014-03-22 MED ORDER — ALBUTEROL SULFATE (2.5 MG/3ML) 0.083% IN NEBU: 2.5000 mg | INHALATION_SOLUTION | Freq: Four times a day (QID) | RESPIRATORY_TRACT | Status: DC | PRN

## 2014-03-22 NOTE — Clinical Social Work Note (Signed)
CSW updated [facility] on pt. Agreeable to weekend d/c if stable. Pt's daughter has appointment to complete paperwork at facility this afternoon.  [name], [place]

## 2014-03-22 NOTE — Progress Notes (Signed)
I have seen and examined this patient and I agree with the above assessment and plan.  Prestyn Mahn, MD [facility] [phone]  

## 2014-03-22 NOTE — Progress Notes (Addendum)
Patient had loose stool this morning and per friend, patient has had some loose stools previously as well.  C.diff sample sent per nursing protocol and patient placed on enteric precautions.

## 2014-03-22 NOTE — Progress Notes (Addendum)
[facility] PROGRESS NOTE  [name] HAL:[id number] DOB: 1917/10/12 DOA: 03/13/2014 PCP: Purvis Kilts, MD   Assessment/Plan:  1. Hypercalcemia, continues with symptoms of confusion, generalized weakness, lethargy, decreased appetite. Calcium level stable at 10.3 status post pamidronate 7/10. Suspect secondary to malignancy. CT abdomen/pelvis and chest with anasarca and soft tissue nodularity. Prominent left lower quadrant abdominal subcutaneous soft tissue nodule and possible right lower lobe nodule. US guided biopsy of abdominal subcu nodules pending. Follow results  2. Acute renal failure. Suspect chronic component as well.  Creatinine continues stable at 1.5 in spite of bolus yesterday. Adequate urine output. Hold nephrotoxins. follow 3. Acute encephalopathy. More alert this am.  Etiology uncertain i.e. Hypercalcemia vs infection vs liver disease. rifaxamin started 7/14. Rocephin started 7/15 for possible UTI. 4. Elevated LFTs, stable. Ultrasound of the liver showed diffuse fatty infiltration which may partially explain findings. No abdominal pain. Mildly elevated ammonia level. Rifaximin started.  5. Hyperkalemia secondary to acute renal failure. resolved 6. History of breast cancer treated with lumpectomy, no chemotherapy or radiation, perhaps 10 years ago. 7. Early dementia: short term memory issue. 8. Leukocytosis: trending upward. Max temp 99.5. sats trending downward slightly. Wheezes on exam.  Will repeat chest xray. Ct chest with bilateral pleural effusions 03/18/14. Urinalysis concerning for UTI. Rocephin day #3.   Code Status: DNR Family Communication:  Disposition Plan: snf when clinically ready   Consultants:  none  Procedures:  US guided biopsy of subcu abdominal nodules 03/21/14  Antibiotics: Rocephin 03/20/14>>>  HPI/Subjective: Alert oriented to self only. Denies pain. Reports feeling "rough". Complains sob.   Objective: Filed Vitals:    03/22/14 0634  BP: 122/77  Pulse: 96  Temp: 98.4 F (36.9 C)  Resp: 17    Intake/Output Summary (Last 24 hours) at 03/22/14 0945 Last data filed at 03/22/14 0820  Gross per 24 hour  Intake    493 ml  Output    250 ml  Net    243 ml   Filed Weights   03/13/14 1403  Weight: 67.132 kg (148 lb)    Exam:   General:  Frail, somewhat pale NAD  Cardiovascular: RRR +murmur no LE edema  Respiratory: mild increased work of breathing with conversation. Diffuse wheeze. No crackles  Abdomen: round non-distended soft +BS non-tender  Musculoskeletal: no clubbing or cyanosis   Data Reviewed: Basic Metabolic Panel:  Recent Labs Lab 03/18/14 0550 03/19/14 0551 03/20/14 0613 03/21/14 0518 03/22/14 0618  NA 140 139 139 137 139  K 3.9 3.7 3.6* 3.7 3.9  CL 110 109 109 107 109  CO2 19 17* 18* 17* 16*  GLUCOSE 74 74 76 92 95  BUN 40* 39* 42* 44* 46*  CREATININE 1.30* 1.33* 1.49* 1.53* 1.58*  CALCIUM 10.8* 10.3 10.3 10.2 10.1   Liver Function Tests:  Recent Labs Lab 03/15/14 1006 03/16/14 0618 03/17/14 0530 03/19/14 1139  AST 60* 48* 51* 55*  ALT 41* 35 37* 39*  ALKPHOS 310* 275* 290* 287*  BILITOT 0.5 0.5 0.6 0.8  PROT 5.2* 4.5* 4.5* 4.7*  ALBUMIN 2.4* 2.1* 2.2* 2.2*    Recent Labs Lab 03/15/14 1159  LIPASE 40    Recent Labs Lab 03/19/14 1139  AMMONIA 76*   CBC:  Recent Labs Lab 03/19/14 0551 03/20/14 0613 03/21/14 0518 03/22/14 0618  WBC 13.3* 15.2* 16.0* 18.0*  HGB 9.4* 9.4* 9.3* 9.5*  HCT 27.8* 28.0* 27.4* 28.8*  MCV 83.0 83.3 83.8 85.2  PLT PLATELET CLUMPS NOTED ON SMEAR, COUNT  APPEARS ADEQUATE PLATELET CLUMPS NOTED ON SMEAR, COUNT APPEARS ADEQUATE 194 205   Cardiac Enzymes: No results found for this basename: CKTOTAL, CKMB, CKMBINDEX, TROPONINI,  in the last 168 hours BNP (last 3 results)  Recent Labs  03/13/14 1445  PROBNP 1434.0*   CBG:  Recent Labs Lab 03/20/14 1102  GLUCAP 88    Recent Results (from the past 240 hour(s))   URINE CULTURE     Status: None   Collection Time    03/13/14  4:45 PM      Result Value Ref Range Status   Specimen Description URINE, CLEAN CATCH   Final   Special Requests NONE   Final   Culture  Setup Time     Final   Value: 03/14/2014 04:28     Performed at [place]     Final   Value: 60,000 COLONIES/ML     Performed at [facility]   Culture     Final   Value: Multiple bacterial morphotypes present, none predominant. Suggest appropriate recollection if clinically indicated.     Performed at [facility]   Report Status 03/15/2014 FINAL   Final  MRSA PCR SCREENING     Status: None   Collection Time    03/13/14  7:30 PM      Result Value Ref Range Status   MRSA by PCR NEGATIVE  NEGATIVE Final   Comment:            The GeneXpert MRSA Assay (FDA     approved for NASAL specimens     only), is one component of a     comprehensive MRSA colonization     surveillance program. It is not     intended to diagnose MRSA     infection nor to guide or     monitor treatment for     MRSA infections.  CLOSTRIDIUM DIFFICILE BY PCR     Status: None   Collection Time    03/16/14  7:20 PM      Result Value Ref Range Status   C difficile by pcr NEGATIVE  NEGATIVE Final  URINE CULTURE     Status: None   Collection Time    03/19/14  5:15 PM      Result Value Ref Range Status   Specimen Description URINE, CLEAN CATCH   Final   Special Requests NONE   Final   Culture  Setup Time     Final   Value: 03/20/2014 14:28     Performed at [place]     Final   Value: >=100,000 COLONIES/ML     Performed at [facility]   Culture     Final   Value: Multiple bacterial morphotypes present, none predominant. Suggest appropriate recollection if clinically indicated.     Performed at [facility]   Report Status 03/21/2014 FINAL   Final     Studies: Mr Brain Wo Contrast  03/20/2014   CLINICAL DATA:  Gross cancer.  Possible stroke. Mental status changes.  EXAM: MRI HEAD WITHOUT CONTRAST  TECHNIQUE: Multiplanar, multiecho pulse sequences of the brain and surrounding structures were obtained without intravenous contrast.  COMPARISON:  Head CT 03/16/2014  FINDINGS: The study Sever's from considerable motion degradation. No acute infarction is demonstrated. 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 There is old infarction in the right external capsule region and radiating white matter tracts. There is  chronic small-vessel ischemic change throughout the hemispheric white matter. There is an old right parietal cortical infarction. No mass lesion, hemorrhage, hydrocephalus or extra-axial collection. No pituitary mass. No skull base lesion seen.  IMPRESSION: Significant motion degradation. No sign of acute or reversible process. Old ischemic changes throughout the brain as outlined above.   Electronically Signed   By: Nelson Chimes M.D.   On: 03/20/2014 12:50   US Abdomen Limited  03/20/2014   ADDENDUM REPORT: 03/20/2014 17:22  ADDENDUM: Additional images were obtained in the left pelvic subcutaneous tissues. There is a poorly defined heterogeneous nodular structure in the left iliac crest region that roughly measures 1.5 x 0.7 x 1.6 cm. There is another small hypoechoic nodular structure that measures up to 0.7 cm. There is an oval-shaped hyperechoic area that measures up to 1.1 cm and may represent fat. There are additional ill-defined hypoechoic areas.  Impression: Scattered heterogeneous nodular structures in the subcutaneous tissues. Findings are similar to the recent CT exam from the 03/18/2014. These ill-defined subcutaneous structures are nonspecific.   Electronically Signed   By: Markus Daft M.D.   On: 03/20/2014 17:22   03/20/2014   CLINICAL DATA:  Possible nodules on left lower quadrant.  EXAM: LIMITED ABDOMINAL ULTRASOUND  COMPARISON:  CT scan of March 18, 2014.  FINDINGS: Limited sonographic evaluation was  performed of the left lower quadrant of the abdomen. No definite nodules, masses or fluid collection were identified in this area.  IMPRESSION: No definite mass or nodules are identified in the left lower quadrant of the abdomen by ultrasound.  Electronically Signed: By: Sabino Dick M.D. On: 03/20/2014 13:29   US Biopsy  03/21/2014   CLINICAL DATA:  80 year old with nodular structures in the subcutaneous tissues on recent CT imaging. Tissue diagnosis has been requested.  EXAM: ULTRASOUND-GUIDED BIOPSY OF A SUBCUTANEOUS NODULE / TISSUE  Physician: Stephan Minister. Anselm Pancoast, MD  FLUOROSCOPY TIME:  None  MEDICATIONS: None  ANESTHESIA/SEDATION: Moderate sedation time: None  PROCEDURE: Informed consent was obtained for an ultrasound-guided biopsy. The area of concern is in the left lower abdominal and pelvic subcutaneous tissues. The CT examination from 03/18/2014 was reviewed. Some of these nodular structures appear to represent normal appearing lymph nodes, particularly along the anterior left pelvic region. There is an oval-shaped structure in the left lateral subcutaneous tissues that appears correspond with one of the larger nodular structures on the recent CT. This area was targeted for biopsy. The skin was prepped and draped in sterile fashion. Skin was anesthetized with 1% lidocaine. An 18 gauge core needle was directed into this lesion and core biopsies obtained. A total of 5 core biopsies were obtained. Four of the samples were placed in formalin and one was placed in saline. Bandage placed over the puncture site.  FINDINGS: Heterogeneous and slightly hyperechoic nodular structure in the left lower abdominal subcutaneous tissues. Ultrasound characteristics are nonspecific. This could represent focal fat with surrounding edema. Difficult to exclude a subcutaneous nodule. Needle position was confirmed within this tissue.  COMPLICATIONS: None  IMPRESSION: Ultrasound-guided core biopsies of a nodular subcutaneous structure.    Electronically Signed   By: Markus Daft M.D.   On: 03/21/2014 21:52    Scheduled Meds: . amLODipine  5 mg Oral QHS  . cefTRIAXone (ROCEPHIN)  IV  1 g Intravenous Q24H  . feeding supplement (RESOURCE BREEZE)  1 Container Oral TID BM  . megestrol  800 mg Oral Daily  . pantoprazole  40 mg Oral Daily  .  rifaximin  550 mg Oral BID  . sodium chloride  3 mL Intravenous Q12H   Continuous Infusions:   Principal Problem:   Acute renal failure Active Problems:   Diarrhea   GERD (gastroesophageal reflux disease)   Dehydration   Hypertension   Hyperlipidemia   Hyperkalemia   Abnormal CT of brain   Hypercalcemia   Acute encephalopathy   Elevated transaminase level    Time spent: [facility] Pager [phone]. If 7PM-7AM, please contact night-coverage at [email or web address], password [facility] 03/22/2014, 9:45 AM  LOS: 9 days

## 2014-03-23 LAB — COMPREHENSIVE METABOLIC PANEL
ALK PHOS: 211 U/L — AB (ref 39–117)
ALT: 26 U/L (ref 0–35)
ANION GAP: 15 (ref 5–15)
AST: 36 U/L (ref 0–37)
Albumin: 2 g/dL — ABNORMAL LOW (ref 3.5–5.2)
BILIRUBIN TOTAL: 0.7 mg/dL (ref 0.3–1.2)
BUN: 49 mg/dL — AB (ref 6–23)
CHLORIDE: 109 meq/L (ref 96–112)
CO2: 17 mEq/L — ABNORMAL LOW (ref 19–32)
Calcium: 10 mg/dL (ref 8.4–10.5)
Creatinine, Ser: 1.73 mg/dL — ABNORMAL HIGH (ref 0.50–1.10)
GFR, EST AFRICAN AMERICAN: 28 mL/min — AB (ref >90)
GFR, EST NON AFRICAN AMERICAN: 24 mL/min — AB (ref >90)
GLUCOSE: 98 mg/dL (ref 70–99)
POTASSIUM: 3.6 meq/L — AB (ref 3.7–5.3)
Sodium: 141 mEq/L (ref 137–147)
Total Protein: 4.4 g/dL — ABNORMAL LOW (ref 6.0–8.3)

## 2014-03-23 LAB — CBC
HCT: 29.6 % — ABNORMAL LOW (ref 36.0–46.0)
Hemoglobin: 9.5 g/dL — ABNORMAL LOW (ref 12.0–15.0)
MCH: 27.3 pg (ref 26.0–34.0)
MCHC: 32.1 g/dL (ref 30.0–36.0)
MCV: 85.1 fL (ref 78.0–100.0)
Platelets: 171 10*3/uL (ref 150–400)
RBC: 3.48 MIL/uL — ABNORMAL LOW (ref 3.87–5.11)
RDW: 21.7 % — ABNORMAL HIGH (ref 11.5–15.5)
WBC: 19.2 10*3/uL — ABNORMAL HIGH (ref 4.0–10.5)

## 2014-03-23 MED ORDER — PIPERACILLIN-TAZOBACTAM IN DEX 2-0.25 GM/50ML IV SOLN
2.2500 g | Freq: Four times a day (QID) | INTRAVENOUS | Status: DC
  Administered 2014-03-24 – 2014-03-27 (×11): 2.25 g via INTRAVENOUS
  Filled 2014-03-23 (×20): qty 50

## 2014-03-23 MED ORDER — CEFTRIAXONE SODIUM 500 MG IJ SOLR
INTRAMUSCULAR | Status: AC
  Filled 2014-03-23: qty 500

## 2014-03-23 MED ORDER — CEFTRIAXONE SODIUM 500 MG IJ SOLR
500.0000 mg | Freq: Once | INTRAMUSCULAR | Status: AC
  Administered 2014-03-23: 500 mg via INTRAMUSCULAR
  Filled 2014-03-23: qty 500

## 2014-03-23 MED ORDER — VANCOMYCIN HCL IN DEXTROSE 1-5 GM/200ML-% IV SOLN
1000.0000 mg | INTRAVENOUS | Status: DC
  Administered 2014-03-23 – 2014-03-25 (×2): 1000 mg via INTRAVENOUS
  Filled 2014-03-23 (×3): qty 200

## 2014-03-23 MED ORDER — PIPERACILLIN-TAZOBACTAM IN DEX 2-0.25 GM/50ML IV SOLN
2.2500 g | Freq: Four times a day (QID) | INTRAVENOUS | Status: DC
  Administered 2014-03-23: 2.25 g via INTRAVENOUS
  Filled 2014-03-23 (×9): qty 50

## 2014-03-23 NOTE — Progress Notes (Signed)
ANTIBIOTIC CONSULT NOTE - INITIAL  Pharmacy Consult for Vancomycin & Zosyn Indication: rule out sepsis  Allergies  Allergen Reactions  . Metoclopramide Hcl     Patient Measurements: Height: 5' 5.5" (166.4 cm) Weight: 148 lb (67.132 kg) IBW/kg (Calculated) : 58.15  Vital Signs: Temp: 98.4 F (36.9 C) (07/18 0645) Temp src: Oral (07/18 0645) BP: 124/33 mmHg (07/18 0645) Pulse Rate: 100 (07/18 0645) Intake/Output from previous day: 07/17 0701 - 07/18 0700 In: 243 [P.O.:240; I.V.:3] Out: 375 [Urine:375] Intake/Output from this shift: Total I/O In: -  Out: 400 [Urine:400]  Labs:  Recent Labs  03/21/14 0518 03/22/14 0618 03/23/14 0547  WBC 16.0* 18.0* 19.2*  HGB 9.3* 9.5* 9.5*  PLT 194 205 171  CREATININE 1.53* 1.58* 1.73*   Estimated Creatinine Clearance: 18.3 ml/min (by C-G formula based on Cr of 1.73). No results found for this basename: VANCOTROUGH, Corlis Leak, VANCORANDOM, GENTTROUGH, GENTPEAK, GENTRANDOM, TOBRATROUGH, TOBRAPEAK, TOBRARND, AMIKACINPEAK, AMIKACINTROU, AMIKACIN,  in the last 72 hours   Microbiology: Recent Results (from the past 720 hour(s))  URINE CULTURE     Status: None   Collection Time    03/13/14  4:45 PM      Result Value Ref Range Status   Specimen Description URINE, CLEAN CATCH   Final   Special Requests NONE   Final   Culture  Setup Time     Final   Value: 03/14/2014 04:28     Performed at [place]     Final   Value: 60,000 COLONIES/ML     Performed at [facility]   Culture     Final   Value: Multiple bacterial morphotypes present, none predominant. Suggest appropriate recollection if clinically indicated.     Performed at [facility]   Report Status 03/15/2014 FINAL   Final  MRSA PCR SCREENING     Status: None   Collection Time    03/13/14  7:30 PM      Result Value Ref Range Status   MRSA by PCR NEGATIVE  NEGATIVE Final   Comment:            The GeneXpert MRSA Assay (FDA   approved for NASAL specimens     only), is one component of a     comprehensive MRSA colonization     surveillance program. It is not     intended to diagnose MRSA     infection nor to guide or     monitor treatment for     MRSA infections.  CLOSTRIDIUM DIFFICILE BY PCR     Status: None   Collection Time    03/16/14  7:20 PM      Result Value Ref Range Status   C difficile by pcr NEGATIVE  NEGATIVE Final  URINE CULTURE     Status: None   Collection Time    03/19/14  5:15 PM      Result Value Ref Range Status   Specimen Description URINE, CLEAN CATCH   Final   Special Requests NONE   Final   Culture  Setup Time     Final   Value: 03/20/2014 14:28     Performed at [place]     Final   Value: >=100,000 COLONIES/ML     Performed at [facility]   Culture     Final   Value: Multiple bacterial morphotypes present, none predominant. Suggest appropriate recollection if clinically indicated.  Performed at [facility]   Report Status 03/21/2014 FINAL   Final  CLOSTRIDIUM DIFFICILE BY PCR     Status: None   Collection Time    03/22/14 10:00 AM      Result Value Ref Range Status   C difficile by pcr NEGATIVE  NEGATIVE Final    Medical History: Past Medical History  Diagnosis Date  . Lumbar facet arthropathy   . Primary localized osteoarthrosis, hand   . Lumbosacral spondylosis without myelopathy   . Pain in joint, lower leg   . Hypertension   . Hyperlipidemia   . Palpitation   . Aortic valve stenosis   . Murmur 04/2012    2D Echo EF 65-70%, she did have moderate aortic stenosis with a mean gradient of 15, maximum gradient 21, and calculated aortic valve area of 1.05 cm2 to 1.1 cm2, she did have moderate tricuspid regurgitation and moderate pulmonary hypentension with a PA pressure of 64mm, there was trace of mild MR.  . Breast cancer     Medications:  Scheduled:  . amLODipine  5 mg Oral QHS  . feeding supplement (RESOURCE  BREEZE)  1 Container Oral TID BM  . megestrol  800 mg Oral Daily  . pantoprazole  40 mg Oral Daily  . piperacillin-tazobactam (ZOSYN)  IV  2.25 g Intravenous 4 times per day  . rifaximin  550 mg Oral BID  . sodium chloride  3 mL Intravenous Q12H  . vancomycin  1,000 mg Intravenous Q48H   Assessment: 80 yo F who has been hospitalized since 03/13/14.  Her condition is deteriorating on IV Rocephin.  AMS worsened, WBC increasing.  No source of infection identified.  Antibiotics are being broadened to [place] today.   Acute renal failure noted.  Estimated CrCl 15-20 ml/min today.   Rocephin 03/20/14>>>7/18  Vancomycin 7/18 >>  Zosyn 7/18 >>  Goal of Therapy:  Vancomycin trough level 15-20 mcg/ml  Plan:  Zosyn 2.25gm IV q6h Vancomycin 1gm IV q48h Check Vancomycin trough at steady state Monitor renal function and cx data   Biagio Borg 03/23/2014,10:36 AM

## 2014-03-23 NOTE — Progress Notes (Signed)
Patient not given any PO meds this AM d/t lethargy.  Has not been eating or drinking well PO, having difficulty getting patient to awake enough to sip liquids.  Did receive ativan over night.  Will continue to monitor and give meds if pt awakens more.

## 2014-03-23 NOTE — Progress Notes (Signed)
Patients IV infiltrated at R wrist site.  Lower arm red and edematous. IV removed and heat applied.  MD called and made aware.  Will attempt to regain access if possible.

## 2014-03-23 NOTE — Progress Notes (Addendum)
PROGRESS NOTE  [name] GHW:[id number] DOB: [birth year] DOA: 03/13/2014 PCP: Purvis Kilts, MD  HPI: [name] is a 80 y.o. female with past medical history that includes hypertension, hyperlipidemia, aortic valve stenosis presents to the emergency department with chief complaint of weakness.  Subjective: - patient asleep  Assessment/Plan: Acute encephalopathy on baseline chronic dementia - this is likely multifactorial, patient with chronic cognitive deficits had a fall few days prior to admission, likely hypercalcemia contributing, weakness from prolonged hospitalization, presumed infection from an unknown origin and progressive renal failure. Her mental status is continuing to get worse despite aggressive treatment with IV antibiotics and multiple imaging modalities and labs to identify a clear reversible etiology. Her ammonia level was a bit elevated and Rifaximin was started however that didn't seem to help. I suspect underlying malignancy and rapid progression of her underlying dementia. - discussed with daughter she is deteriorating and has a very poor short term prognosis - if no improvement in 24 hours, will refer to hospice  Hypercalcemia, continues with symptoms of confusion, generalized weakness, lethargy, decreased appetite. Calcium level stable status post pamidronate 7/10. Suspect secondary to malignancy. CT abdomen/pelvis and chest with anasarca and soft tissue nodularity. Prominent left lower quadrant abdominal subcutaneous soft tissue nodule and possible right lower lobe nodule. US guided biopsy of abdominal subcutaneous nodules pending. Follow results   Acute renal failure. Suspect chronic component as well, worsening despite small bolus 2 days ago and Lasix yesterday. I have kindly asked nephrology to weigh in as well today, and I appreciate input.  Elevated LFTs, stable. Ultrasound of the liver showed diffuse fatty infiltration which may partially  explain findings.  History of breast cancer treated with lumpectomy, no chemotherapy or radiation, perhaps 10 years ago.  Dementia: short term memory issue.  Leukocytosis: trending upward still, ?infectio from unknown source, start broad spectrum antibiotics today.   Diet: heart Fluids: none DVT Prophylaxis: SCD  Code Status: DNR Family Communication: daughter bedside  Disposition Plan: inpatient   Consultants:  Nephrology   Procedures:  None    Antibiotics Rocephin 03/20/14>>>7/18 Vancomycin 7/18 >> Zosyn 7/18 >>  Objective: Filed Vitals:   03/22/14 1625 03/22/14 2027 03/22/14 2229 03/23/14 0645  BP: 128/52 133/73 148/50 124/33  Pulse: 98 101 56 100  Temp: 98.5 F (36.9 C) 97.8 F (36.6 C) 98.4 F (36.9 C) 98.4 F (36.9 C)  TempSrc: Oral Oral Oral Oral  Resp: 20 20 28 24   Height:      Weight:      SpO2: 97% 96% 96% 95%    Intake/Output Summary (Last 24 hours) at 03/23/14 1047 Last data filed at 03/23/14 0715  Gross per 24 hour  Intake    240 ml  Output    775 ml  Net   -535 ml   Filed Weights   03/13/14 1403  Weight: 67.132 kg (148 lb)   Exam:  General:  Drowsy, sleeping  Cardiovascular: regular rate and rhythm, without MRG  Respiratory: no wheezing today, moves air well but somewhat shallow  Abdomen: soft, not tender to palpation, positive bowel sounds  MSK: no peripheral edema  Data Reviewed: Basic Metabolic Panel:  Recent Labs Lab 03/19/14 0551 03/20/14 0613 03/21/14 0518 03/22/14 0618 03/23/14 0547  NA 139 139 137 139 141  K 3.7 3.6* 3.7 3.9 3.6*  CL 109 109 107 109 109  CO2 17* 18* 17* 16* 17*  GLUCOSE 74 76 92 95 98  BUN 39* 42* 44* 46* 49*  CREATININE 1.33* 1.49* 1.53* 1.58* 1.73*  CALCIUM 10.3 10.3 10.2 10.1 10.0   Liver Function Tests:  Recent Labs Lab 03/17/14 0530 03/19/14 1139 03/23/14 0547  AST 51* 55* 36  ALT 37* 39* 26  ALKPHOS 290* 287* 211*  BILITOT 0.6 0.8 0.7  PROT 4.5* 4.7* 4.4*  ALBUMIN 2.2* 2.2*  2.0*    Recent Labs Lab 03/19/14 1139  AMMONIA 76*   CBC:  Recent Labs Lab 03/19/14 0551 03/20/14 0613 03/21/14 0518 03/22/14 0618 03/23/14 0547  WBC 13.3* 15.2* 16.0* 18.0* 19.2*  HGB 9.4* 9.4* 9.3* 9.5* 9.5*  HCT 27.8* 28.0* 27.4* 28.8* 29.6*  MCV 83.0 83.3 83.8 85.2 85.1  PLT PLATELET CLUMPS NOTED ON SMEAR, COUNT APPEARS ADEQUATE PLATELET CLUMPS NOTED ON SMEAR, COUNT APPEARS ADEQUATE 194 205 171    BNP (last 3 results)  Recent Labs  03/13/14 1445  PROBNP 1434.0*   CBG:  Recent Labs Lab 03/20/14 1102  GLUCAP 88    Recent Results (from the past 240 hour(s))  URINE CULTURE     Status: None   Collection Time    03/13/14  4:45 PM      Result Value Ref Range Status   Specimen Description URINE, CLEAN CATCH   Final   Special Requests NONE   Final   Culture  Setup Time     Final   Value: 03/14/2014 04:28     Performed at [facility]     Final   Value: 60,000 COLONIES/ML     Performed at [facility]   Culture     Final   Value: Multiple bacterial morphotypes present, none predominant. Suggest appropriate recollection if clinically indicated.     Performed at [facility]   Report Status 03/15/2014 FINAL   Final  MRSA PCR SCREENING     Status: None   Collection Time    03/13/14  7:30 PM      Result Value Ref Range Status   MRSA by PCR NEGATIVE  NEGATIVE Final   Comment:            The GeneXpert MRSA Assay (FDA     approved for NASAL specimens     only), is one component of a     comprehensive MRSA colonization     surveillance program. It is not     intended to diagnose MRSA     infection nor to guide or     monitor treatment for     MRSA infections.  CLOSTRIDIUM DIFFICILE BY PCR     Status: None   Collection Time    03/16/14  7:20 PM      Result Value Ref Range Status   C difficile by pcr NEGATIVE  NEGATIVE Final  URINE CULTURE     Status: None   Collection Time    03/19/14  5:15 PM      Result Value Ref  Range Status   Specimen Description URINE, CLEAN CATCH   Final   Special Requests NONE   Final   Culture  Setup Time     Final   Value: 03/20/2014 14:28     Performed at [place]     Final   Value: >=100,000 COLONIES/ML     Performed at [facility]   Culture     Final   Value: Multiple bacterial morphotypes present, none predominant. Suggest appropriate recollection if clinically indicated.  Performed at [facility]   Report Status 03/21/2014 FINAL   Final  CLOSTRIDIUM DIFFICILE BY PCR     Status: None   Collection Time    03/22/14 10:00 AM      Result Value Ref Range Status   C difficile by pcr NEGATIVE  NEGATIVE Final     Studies: US Biopsy  03/21/2014   CLINICAL DATA:  80 year old with nodular structures in the subcutaneous tissues on recent CT imaging. Tissue diagnosis has been requested.  EXAM: ULTRASOUND-GUIDED BIOPSY OF A SUBCUTANEOUS NODULE / TISSUE  Physician: Stephan Minister. Anselm Pancoast, MD  FLUOROSCOPY TIME:  None  MEDICATIONS: None  ANESTHESIA/SEDATION: Moderate sedation time: None  PROCEDURE: Informed consent was obtained for an ultrasound-guided biopsy. The area of concern is in the left lower abdominal and pelvic subcutaneous tissues. The CT examination from 03/18/2014 was reviewed. Some of these nodular structures appear to represent normal appearing lymph nodes, particularly along the anterior left pelvic region. There is an oval-shaped structure in the left lateral subcutaneous tissues that appears correspond with one of the larger nodular structures on the recent CT. This area was targeted for biopsy. The skin was prepped and draped in sterile fashion. Skin was anesthetized with 1% lidocaine. An 18 gauge core needle was directed into this lesion and core biopsies obtained. A total of 5 core biopsies were obtained. Four of the samples were placed in formalin and one was placed in saline. Bandage placed over the puncture site.  FINDINGS:  Heterogeneous and slightly hyperechoic nodular structure in the left lower abdominal subcutaneous tissues. Ultrasound characteristics are nonspecific. This could represent focal fat with surrounding edema. Difficult to exclude a subcutaneous nodule. Needle position was confirmed within this tissue.  COMPLICATIONS: None  IMPRESSION: Ultrasound-guided core biopsies of a nodular subcutaneous structure.   Electronically Signed   By: Markus Daft M.D.   On: 03/21/2014 21:52   Dg Chest Port 1 View  03/22/2014   CLINICAL DATA:  Shortness of breath and fever  EXAM: PORTABLE CHEST - 1 VIEW  COMPARISON:  Chest radiograph 03/13/2014, chest CT 03/18/2014  FINDINGS: Patient is rotated to the left. Small pleural effusions are reidentified. Diffusely prominent interstitial reticular opacities are noted. Findings are slightly more prominent than previously. Heart size is normal. Remote left humeral fracture reidentified.  IMPRESSION: Increased pleural effusions and possible interstitial edema.   Electronically Signed   By: Conchita Paris M.D.   On: 03/22/2014 11:00    Scheduled Meds: . amLODipine  5 mg Oral QHS  . feeding supplement (RESOURCE BREEZE)  1 Container Oral TID BM  . megestrol  800 mg Oral Daily  . pantoprazole  40 mg Oral Daily  . piperacillin-tazobactam (ZOSYN)  IV  2.25 g Intravenous 4 times per day  . rifaximin  550 mg Oral BID  . sodium chloride  3 mL Intravenous Q12H  . vancomycin  1,000 mg Intravenous Q48H   Continuous Infusions:   Principal Problem:   Acute renal failure Active Problems:   Diarrhea   GERD (gastroesophageal reflux disease)   Dehydration   Hypertension   Hyperlipidemia   Hyperkalemia   Abnormal CT of brain   Hypercalcemia   Acute encephalopathy   Elevated transaminase level   Time spent: 25  This note has been created with Surveyor, quantity. Any transcriptional errors are unintentional.   Marzetta Board, MD [facility] Pager [phone]. If 7 PM - 7 AM, please contact night-coverage at [email or web address], password  [email or web address] 03/23/2014, 10:47 AM  LOS: 10 days

## 2014-03-24 LAB — CBC
HCT: 27.2 % — ABNORMAL LOW (ref 36.0–46.0)
Hemoglobin: 9.1 g/dL — ABNORMAL LOW (ref 12.0–15.0)
MCH: 28.6 pg (ref 26.0–34.0)
MCHC: 33.5 g/dL (ref 30.0–36.0)
MCV: 85.5 fL (ref 78.0–100.0)
PLATELETS: 190 10*3/uL (ref 150–400)
RBC: 3.18 MIL/uL — AB (ref 3.87–5.11)
RDW: 22.2 % — ABNORMAL HIGH (ref 11.5–15.5)
WBC: 18.5 10*3/uL — AB (ref 4.0–10.5)

## 2014-03-24 LAB — BASIC METABOLIC PANEL
ANION GAP: 15 (ref 5–15)
BUN: 53 mg/dL — ABNORMAL HIGH (ref 6–23)
CALCIUM: 9.5 mg/dL (ref 8.4–10.5)
CO2: 17 meq/L — AB (ref 19–32)
CREATININE: 1.65 mg/dL — AB (ref 0.50–1.10)
Chloride: 108 mEq/L (ref 96–112)
GFR calc Af Amer: 30 mL/min — ABNORMAL LOW (ref >90)
GFR calc non Af Amer: 25 mL/min — ABNORMAL LOW (ref >90)
GLUCOSE: 95 mg/dL (ref 70–99)
Potassium: 3.6 mEq/L — ABNORMAL LOW (ref 3.7–5.3)
Sodium: 140 mEq/L (ref 137–147)

## 2014-03-24 LAB — AMMONIA: Ammonia: 68 umol/L — ABNORMAL HIGH (ref 11–60)

## 2014-03-24 MED ORDER — SODIUM CHLORIDE 0.9 % IJ SOLN: 10.0000 mL | INTRAMUSCULAR | Status: DC | PRN

## 2014-03-24 MED ORDER — LACTULOSE ENEMA
300.0000 mL | Freq: Four times a day (QID) | ORAL | Status: AC
  Administered 2014-03-24 – 2014-03-25 (×2): 300 mL via RECTAL
  Filled 2014-03-24 (×2): qty 300

## 2014-03-24 MED ORDER — SODIUM CHLORIDE 0.9 % IJ SOLN
10.0000 mL | Freq: Two times a day (BID) | INTRAMUSCULAR | Status: DC
Start: 2014-03-24 — End: 2014-03-27
  Administered 2014-03-24: 20 mL
  Administered 2014-03-25 – 2014-03-27 (×5): 10 mL

## 2014-03-24 MED ORDER — SODIUM CHLORIDE 4 MEQ/ML IV SOLN
INTRAVENOUS | Status: DC
  Administered 2014-03-24 – 2014-03-27 (×5): via INTRAVENOUS
  Filled 2014-03-24 (×8): qty 9.7

## 2014-03-24 NOTE — Progress Notes (Signed)
Patient unable to take PO meds again this morning - will not arouse enough at this time, remains lethargic

## 2014-03-24 NOTE — Progress Notes (Signed)
Dr. Lowanda Foster with Nephrology consulted with on PICC line placement - ok to resume with placing PICC line

## 2014-03-24 NOTE — Progress Notes (Signed)
Peripherally Inserted Central Catheter/Midline Placement  The IV Nurse has discussed with the patient and/or persons authorized to consent for the patient, the purpose of this procedure and the potential benefits and risks involved with this procedure.  The benefits include less needle sticks, lab draws from the catheter and patient may be discharged home with the catheter.  Risks include, but not limited to, infection, bleeding, blood clot (thrombus formation), and puncture of an artery; nerve damage and irregular heat beat.  Alternatives to this procedure were also discussed. Pt unable to sign consent, signed by daughter.  PICC/Midline Placement Documentation  PICC / Midline Double Lumen 03/24/14 Right Brachial 37 cm 0 cm (Active)  Indication for Insertion or Continuance of Line Limited venous access - need for IV therapy >5 days (PICC only);Poor Vasculature-patient has had multiple peripheral attempts or PIVs lasting less than 24 hours 03/24/2014  1:18 PM  Exposed Catheter (cm) 0 cm 03/24/2014  1:18 PM  Site Assessment Clean;Dry;Intact 03/24/2014  1:18 PM  Lumen #1 Status Flushed;Saline locked;Blood return noted 03/24/2014  1:18 PM  Lumen #2 Status Flushed;Saline locked;Blood return noted 03/24/2014  1:18 PM  Dressing Type Transparent 03/24/2014  1:18 PM  Dressing Status Clean;Dry;Intact;Antimicrobial disc in place 03/24/2014  1:18 PM  Line Care Connections checked and tightened 03/24/2014  1:18 PM  Line Adjustment (NICU/IV Team Only) No 03/24/2014  1:18 PM  Dressing Intervention New dressing 03/24/2014  1:18 PM  Dressing Change Due 03/31/14 03/24/2014  1:18 PM       Rolena Infante 03/24/2014, 1:19 PM

## 2014-03-24 NOTE — Progress Notes (Signed)
Patient given lactulose enema this evening.  Patient was not able to hold full amount very well, though - started leaking out before could give entire dose.  Had medium amount of loose stool with it.

## 2014-03-24 NOTE — Progress Notes (Signed)
PROGRESS NOTE  [name] KZS:[id number] DOB: 01-04-1918 DOA: 03/13/2014 PCP: Purvis Kilts, MD  HPI: [name] is a 80 y.o. female with past medical history that includes hypertension, hyperlipidemia, aortic valve stenosis presents to the emergency department with chief complaint of weakness.  Subjective: - patient lethargic, tries to wake up  Assessment/Plan: Acute encephalopathy on baseline chronic dementia - this is likely multifactorial, patient with chronic cognitive deficits had a fall few days prior to admission, likely hypercalcemia contributing, weakness from prolonged hospitalization, presumed infection from an unknown origin and progressive renal failure. Her mental status is continuing to get worse despite aggressive treatment with IV antibiotics and multiple imaging modalities and labs to identify a clear reversible etiology. Her ammonia level was a bit elevated and Rifaximin was started however that didn't seem to help. I suspect underlying malignancy and rapid progression of her underlying dementia. - discussed with daughter she is deteriorating and has a very poor short term prognosis - I am afraid that she may not survive this hospitalization.   Hypercalcemia, continues with symptoms of confusion, generalized weakness, lethargy, decreased appetite. Calcium level stable status post pamidronate 7/10. Suspect secondary to malignancy. CT abdomen/pelvis and chest with anasarca and soft tissue nodularity. Prominent left lower quadrant abdominal subcutaneous soft tissue nodule and possible right lower lobe nodule. US guided biopsy of abdominal subcutaneous nodules pending.   Acute renal failure. Suspect chronic component as well, worsening despite small bolus 2 days ago and Lasix yesterday. - nephrology seeing as well  Elevated LFTs, stable. Ultrasound of the liver showed diffuse fatty infiltration which may partially explain findings.  History of breast  cancer treated with lumpectomy, no chemotherapy or radiation, perhaps 10 years ago.  Dementia: short term memory issue.  Leukocytosis: trending upward still, stable  Diet: heart Fluids: none DVT Prophylaxis: SCD  Code Status: DNR Family Communication: daughter bedside  Disposition Plan: inpatient   Consultants:  Nephrology   Procedures:  None    Antibiotics Rocephin 03/20/14>>>7/18 Vancomycin 7/18 >> Zosyn 7/18 >>  Objective: Filed Vitals:   03/23/14 0645 03/23/14 1506 03/23/14 2207 03/24/14 0621  BP: 124/33 120/59 136/41 132/59  Pulse: 100 105 90 81  Temp: 98.4 F (36.9 C) 98.4 F (36.9 C) 98.2 F (36.8 C) 98.4 F (36.9 C)  TempSrc: Oral  Oral Oral  Resp: 24 20 20 20   Height:      Weight:      SpO2: 95% 94% 95% 95%    Intake/Output Summary (Last 24 hours) at 03/24/14 0903 Last data filed at 03/24/14 7322  Gross per 24 hour  Intake    100 ml  Output    126 ml  Net    -26 ml   Filed Weights   03/13/14 1403  Weight: 67.132 kg (148 lb)   Exam:  General:  Drowsy  Cardiovascular: regular rate and rhythm, without MRG  Respiratory: no wheezing today, moves air well but somewhat shallow  Abdomen: soft, not tender to palpation, positive bowel sounds  MSK: no peripheral edema  Data Reviewed: Basic Metabolic Panel:  Recent Labs Lab 03/20/14 0613 03/21/14 0518 03/22/14 0618 03/23/14 0547 03/24/14 0632  NA 139 137 139 141 140  K 3.6* 3.7 3.9 3.6* 3.6*  CL 109 107 109 109 108  CO2 18* 17* 16* 17* 17*  GLUCOSE 76 92 95 98 95  BUN 42* 44* 46* 49* 53*  CREATININE 1.49* 1.53* 1.58* 1.73* 1.65*  CALCIUM 10.3 10.2 10.1 10.0 9.5  Liver Function Tests:  Recent Labs Lab 03/19/14 1139 03/23/14 0547  AST 55* 36  ALT 39* 26  ALKPHOS 287* 211*  BILITOT 0.8 0.7  PROT 4.7* 4.4*  ALBUMIN 2.2* 2.0*    Recent Labs Lab 03/19/14 1139  AMMONIA 76*   CBC:  Recent Labs Lab 03/20/14 0613 03/21/14 0518 03/22/14 0618 03/23/14 0547  03/24/14 0632  WBC 15.2* 16.0* 18.0* 19.2* 18.5*  HGB 9.4* 9.3* 9.5* 9.5* 9.1*  HCT 28.0* 27.4* 28.8* 29.6* 27.2*  MCV 83.3 83.8 85.2 85.1 85.5  PLT PLATELET CLUMPS NOTED ON SMEAR, COUNT APPEARS ADEQUATE 194 205 171 190    BNP (last 3 results)  Recent Labs  03/13/14 1445  PROBNP 1434.0*   CBG:  Recent Labs Lab 03/20/14 1102  GLUCAP 88    Recent Results (from the past 240 hour(s))  CLOSTRIDIUM DIFFICILE BY PCR     Status: None   Collection Time    03/16/14  7:20 PM      Result Value Ref Range Status   C difficile by pcr NEGATIVE  NEGATIVE Final  URINE CULTURE     Status: None   Collection Time    03/19/14  5:15 PM      Result Value Ref Range Status   Specimen Description URINE, CLEAN CATCH   Final   Special Requests NONE   Final   Culture  Setup Time     Final   Value: 03/20/2014 14:28     Performed at [place]     Final   Value: >=100,000 COLONIES/ML     Performed at [facility]   Culture     Final   Value: Multiple bacterial morphotypes present, none predominant. Suggest appropriate recollection if clinically indicated.     Performed at [facility]   Report Status 03/21/2014 FINAL   Final  CLOSTRIDIUM DIFFICILE BY PCR     Status: None   Collection Time    03/22/14 10:00 AM      Result Value Ref Range Status   C difficile by pcr NEGATIVE  NEGATIVE Final     Studies: Dg Chest Port 1 View  03/22/2014   CLINICAL DATA:  Shortness of breath and fever  EXAM: PORTABLE CHEST - 1 VIEW  COMPARISON:  Chest radiograph 03/13/2014, chest CT 03/18/2014  FINDINGS: Patient is rotated to the left. Small pleural effusions are reidentified. Diffusely prominent interstitial reticular opacities are noted. Findings are slightly more prominent than previously. Heart size is normal. Remote left humeral fracture reidentified.  IMPRESSION: Increased pleural effusions and possible interstitial edema.   Electronically Signed   By: Conchita Paris  M.D.   On: 03/22/2014 11:00    Scheduled Meds: . amLODipine  5 mg Oral QHS  . feeding supplement (RESOURCE BREEZE)  1 Container Oral TID BM  . megestrol  800 mg Oral Daily  . pantoprazole  40 mg Oral Daily  . piperacillin-tazobactam (ZOSYN)  IV  2.25 g Intravenous 4 times per day  . rifaximin  550 mg Oral BID  . sodium chloride  3 mL Intravenous Q12H  . vancomycin  1,000 mg Intravenous Q48H   Continuous Infusions:   Principal Problem:   Acute renal failure Active Problems:   Diarrhea   GERD (gastroesophageal reflux disease)   Dehydration   Hypertension   Hyperlipidemia   Hyperkalemia   Abnormal CT of brain   Hypercalcemia   Acute encephalopathy   Elevated transaminase level   Time  spent: 25  This note has been created with Surveyor, quantity. Any transcriptional errors are unintentional.   Marzetta Board, MD [facility] Pager [phone]. If 7 PM - 7 AM, please contact night-coverage at [email or web address], password [email or web address] 03/24/2014, 9:03 AM  LOS: 11 days

## 2014-03-24 NOTE — Consult Note (Signed)
Reason for Consult: Renal failure Referring Physician: Dr.Gherghe  [name] is an 80 y.o. female.  HPI: She is a patient was history of hypertension, have him take valve stenosis, history of breast cancer status post lumpectomy and radiation treatment presently was brought because of weakness, increased lethargy and confusion. Initially when patient was evaluated she was found to have hypercalcemia and acute renal failure. Presently her hypercalcemia has corrected the patient continued to be very somnolent and confused. Her renal function also seems to be worsening. Presently unable to get any additional history. However looking for blood work from last year her renal function seems to be within normal range. Past Medical History  Diagnosis Date  . Lumbar facet arthropathy   . Primary localized osteoarthrosis, hand   . Lumbosacral spondylosis without myelopathy   . Pain in joint, lower leg   . Hypertension   . Hyperlipidemia   . Palpitation   . Aortic valve stenosis   . Murmur 04/2012    2D Echo EF 65-70%, she did have moderate aortic stenosis with a mean gradient of 15, maximum gradient 21, and calculated aortic valve area of 1.05 cm2 to 1.1 cm2, she did have moderate tricuspid regurgitation and moderate pulmonary hypentension with a PA pressure of 49m, there was trace of mild MR.  . Breast cancer     Past Surgical History  Procedure Laterality Date  . Abdominal hysterectomy    . Spine surgery    . Breast surgery    . Appendectomy  50 yrs ago  . Cholecystectomy    . Hiatal hernia repair    . Tonsillectomy  [id number]    Family History  Problem Relation Age of Onset  . Cancer Father   . Stroke Sister   . Cancer Sister   . Multiple sclerosis Brother     Social History:  reports that she has never smoked. She has never used smokeless tobacco. She reports that she does not drink alcohol or use illicit drugs.  Allergies:  Allergies  Allergen Reactions  .  Metoclopramide Hcl     Medications: I have reviewed the patient's current medications.  Results for orders placed during the hospital encounter of 03/13/14 (from the past 48 hour(s))  CLOSTRIDIUM DIFFICILE BY PCR     Status: None   Collection Time    03/22/14 10:00 AM      Result Value Ref Range   C difficile by pcr NEGATIVE  NEGATIVE  BLOOD GAS, ARTERIAL     Status: Abnormal   Collection Time    03/22/14 12:05 PM      Result Value Ref Range   FIO2 21.00     pH, Arterial 7.384  7.350 - 7.450   pCO2 arterial 28.3 (*) 35.0 - 45.0 mmHg   pO2, Arterial 72.8 (*) 80.0 - 100.0 mmHg   Bicarbonate 16.5 (*) 20.0 - 24.0 mEq/L   TCO2 15.5  0 - 100 mmol/L   Acid-base deficit 7.6 (*) 0.0 - 2.0 mmol/L   O2 Saturation 93.8     Patient temperature 37.0     Collection site LEFT RADIAL     Drawn by COLLECTED BY RT     Sample type ARTERIAL     Allens test (pass/fail) PASS  PASS  COMPREHENSIVE METABOLIC PANEL     Status: Abnormal   Collection Time    03/23/14  5:47 AM      Result Value Ref Range   Sodium 141  137 - 147 mEq/L  Potassium 3.6 (*) 3.7 - 5.3 mEq/L   Chloride 109  96 - 112 mEq/L   CO2 17 (*) 19 - 32 mEq/L   Glucose, Bld 98  70 - 99 mg/dL   BUN 49 (*) 6 - 23 mg/dL   Creatinine, Ser 1.73 (*) 0.50 - 1.10 mg/dL   Calcium 10.0  8.4 - 10.5 mg/dL   Total Protein 4.4 (*) 6.0 - 8.3 g/dL   Albumin 2.0 (*) 3.5 - 5.2 g/dL   AST 36  0 - 37 U/L   ALT 26  0 - 35 U/L   Alkaline Phosphatase 211 (*) 39 - 117 U/L   Total Bilirubin 0.7  0.3 - 1.2 mg/dL   GFR calc non Af Amer 24 (*) >90 mL/min   GFR calc Af Amer 28 (*) >90 mL/min   Comment: (NOTE)     The eGFR has been calculated using the CKD EPI equation.     This calculation has not been validated in all clinical situations.     eGFR's persistently <90 mL/min signify possible Chronic Kidney     Disease.   Anion gap 15  5 - 15  CBC     Status: Abnormal   Collection Time    03/23/14  5:47 AM      Result Value Ref Range   WBC 19.2 (*) 4.0  - 10.5 K/uL   RBC 3.48 (*) 3.87 - 5.11 MIL/uL   Hemoglobin 9.5 (*) 12.0 - 15.0 g/dL   HCT 29.6 (*) 36.0 - 46.0 %   MCV 85.1  78.0 - 100.0 fL   MCH 27.3  26.0 - 34.0 pg   MCHC 32.1  30.0 - 36.0 g/dL   RDW 21.7 (*) 11.5 - 15.5 %   Platelets 171  150 - 400 K/uL   Comment: SPECIMEN CHECKED FOR CLOTS     PLATELET COUNT CONFIRMED BY SMEAR  CBC     Status: Abnormal   Collection Time    03/24/14  6:32 AM      Result Value Ref Range   WBC 18.5 (*) 4.0 - 10.5 K/uL   Comment: WHITE COUNT CONFIRMED ON SMEAR   RBC 3.18 (*) 3.87 - 5.11 MIL/uL   Hemoglobin 9.1 (*) 12.0 - 15.0 g/dL   HCT 27.2 (*) 36.0 - 46.0 %   MCV 85.5  78.0 - 100.0 fL   MCH 28.6  26.0 - 34.0 pg   MCHC 33.5  30.0 - 36.0 g/dL   RDW 22.2 (*) 11.5 - 15.5 %   Platelets 190  150 - 400 K/uL   Comment: LARGE PLATELETS PRESENT     SPECIMEN CHECKED FOR CLOTS     PLATELET COUNT CONFIRMED BY SMEAR  BASIC METABOLIC PANEL     Status: Abnormal   Collection Time    03/24/14  6:32 AM      Result Value Ref Range   Sodium 140  137 - 147 mEq/L   Potassium 3.6 (*) 3.7 - 5.3 mEq/L   Chloride 108  96 - 112 mEq/L   CO2 17 (*) 19 - 32 mEq/L   Glucose, Bld 95  70 - 99 mg/dL   BUN 53 (*) 6 - 23 mg/dL   Creatinine, Ser 1.65 (*) 0.50 - 1.10 mg/dL   Calcium 9.5  8.4 - 10.5 mg/dL   GFR calc non Af Amer 25 (*) >90 mL/min   GFR calc Af Amer 30 (*) >90 mL/min   Comment: (NOTE)     The eGFR  has been calculated using the CKD EPI equation.     This calculation has not been validated in all clinical situations.     eGFR's persistently <90 mL/min signify possible Chronic Kidney     Disease.   Anion gap 15  5 - 15    Dg Chest Port 1 View  03/22/2014   CLINICAL DATA:  Shortness of breath and fever  EXAM: PORTABLE CHEST - 1 VIEW  COMPARISON:  Chest radiograph 03/13/2014, chest CT 03/18/2014  FINDINGS: Patient is rotated to the left. Small pleural effusions are reidentified. Diffusely prominent interstitial reticular opacities are noted. Findings are  slightly more prominent than previously. Heart size is normal. Remote left humeral fracture reidentified.  IMPRESSION: Increased pleural effusions and possible interstitial edema.   Electronically Signed   By: Conchita Paris M.D.   On: 03/22/2014 11:00    Review of Systems  Unable to perform ROS: mental status change   Blood pressure 132/59, pulse 81, temperature 98.4 F (36.9 C), temperature source Oral, resp. rate 20, height 5' 5.5" (1.664 m), weight 67.132 kg (148 lb), SpO2 95.00%. Physical Exam  Constitutional: No distress.  Patient very somnolent and barely arousable.  presently patient is none comunicative  Eyes: No scleral icterus.  Neck: No JVD present.  Cardiovascular: Normal rate and regular rhythm.   No murmur heard. Respiratory: No respiratory distress. She has no wheezes.  GI: She exhibits distension. There is no tenderness. There is no rebound.  Musculoskeletal: She exhibits edema.    Assessment/Plan:  Problem #1 renal failure: Acute. Most likely ATN/prerenal/ischemic. Patient presently is none oliguric. Problem #2 hyperkalemia: Potassium has improved. Problem #3 altered mental status and increased confusion: As this moment her calcium is normal hence possibly that may not be course of her confusion. Patient however was increased liver function tests and also high ammonia level of 76 hence we may be dealing with hepatic encephalopathy assuming her ammonia level is high. At this moment other etiologies cannot ruled out. Problem #4 hypercalcemia: Her intact PTH is low, PTH related to hormone is also low. At this moment patient has elevated 125 vitamin D which is 92. Hence we may be dealing disease is such as hematology current malignancies/lymphoma/sarcoidosis. Problem #5 patient with serum IgG lambda elevation. Etiology not clear to disease such as amyloidosis in ruled out. Problem #5 mildly elevated liver function/ammonia and CT with possible fatty liver disease. Hence  patient might have chronic liver disease. Problem #7 aortic valve stenosis Problem #8 metabolic acidosis Problem #9 breast cancer status post lumpectomy in addition treatment then he should go Problem #10 supple tennis nodule Plan: We'll check her ammonia level today Possibly it is high she may need lactulose. At this moment if further workup is needed patient may need oncology and GI involvement. We'll start patient on 1/4 saline with 100 mEq of sodium bicarbonate to 75 cc per hour We'll check her basic metabolic panel in the morning  Divante Kotch S 03/24/2014, 8:53 AM

## 2014-03-25 LAB — BASIC METABOLIC PANEL
ANION GAP: 17 — AB (ref 5–15)
BUN: 60 mg/dL — AB (ref 6–23)
CHLORIDE: 108 meq/L (ref 96–112)
CO2: 18 mEq/L — ABNORMAL LOW (ref 19–32)
Calcium: 9.2 mg/dL (ref 8.4–10.5)
Creatinine, Ser: 1.76 mg/dL — ABNORMAL HIGH (ref 0.50–1.10)
GFR calc non Af Amer: 24 mL/min — ABNORMAL LOW (ref >90)
GFR, EST AFRICAN AMERICAN: 27 mL/min — AB (ref >90)
Glucose, Bld: 84 mg/dL (ref 70–99)
POTASSIUM: 3.7 meq/L (ref 3.7–5.3)
Sodium: 143 mEq/L (ref 137–147)

## 2014-03-25 LAB — CBC
HCT: 27.4 % — ABNORMAL LOW (ref 36.0–46.0)
Hemoglobin: 9 g/dL — ABNORMAL LOW (ref 12.0–15.0)
MCH: 28.3 pg (ref 26.0–34.0)
MCHC: 32.8 g/dL (ref 30.0–36.0)
MCV: 86.2 fL (ref 78.0–100.0)
Platelets: 174 10*3/uL (ref 150–400)
RBC: 3.18 MIL/uL — AB (ref 3.87–5.11)
RDW: 23.6 % — ABNORMAL HIGH (ref 11.5–15.5)
WBC: 18.8 10*3/uL — ABNORMAL HIGH (ref 4.0–10.5)

## 2014-03-25 MED ORDER — FUROSEMIDE 10 MG/ML IJ SOLN
40.0000 mg | Freq: Two times a day (BID) | INTRAMUSCULAR | Status: DC
  Administered 2014-03-25 – 2014-03-27 (×5): 40 mg via INTRAVENOUS
  Filled 2014-03-25 (×6): qty 4

## 2014-03-25 NOTE — Progress Notes (Signed)
Subjective: Interval History: none.   Objective: Vital signs in last 24 hours: Temp:  [97.7 F (36.5 C)-98.5 F (36.9 C)] 98.5 F (36.9 C) (07/20 0609) Pulse Rate:  [103-105] 105 (07/20 0609) Resp:  [20] 20 (07/20 0609) BP: (113-125)/(44-45) 117/45 mmHg (07/20 0609) SpO2:  [94 %-100 %] 94 % (07/20 0609) Weight change:   Intake/Output from previous day: 07/19 0701 - 07/20 0700 In: 1110 [I.V.:960; IV Piggyback:150] Out: 450 [Urine:450] Intake/Output this shift:    General appearance: Very somnolent but arousable. Presently patient does not content. Resp: diminished breath sounds posterior - bilateral Cardio: regular rate and rhythm, S1, S2 normal, no murmur, click, rub or gallop GI: Obese, nontender positive bowel sound Extremities: extremities normal, atraumatic, no cyanosis or edema  Lab Results:  Recent Labs  03/24/14 0632 03/25/14 0652  WBC 18.5* 18.8*  HGB 9.1* 9.0*  HCT 27.2* 27.4*  PLT 190 174   BMET:  Recent Labs  03/24/14 0632 03/25/14 0652  NA 140 143  K 3.6* 3.7  CL 108 108  CO2 17* 18*  GLUCOSE 95 84  BUN 53* 60*  CREATININE 1.65* 1.76*  CALCIUM 9.5 9.2   No results found for this basename: PTH,  in the last 72 hours Iron Studies: No results found for this basename: IRON, TIBC, TRANSFERRIN, FERRITIN,  in the last 72 hours  Studies/Results: No results found.  I have reviewed the patient's current medications.  Assessment/Plan: Problem #1 renal failure: Possibly acute. Her BUN and creatinine is increasing. Patient is oliguric. Problem #2 metabolic acidosis: Patient on sodium bicarbonate her CO2 is 18. Presently remains low but better. Problem #3 anemia: Her hemoglobin hematocrit is stable. Problem #4 altered mental status: Etiology not clear. Possibly hepatic encephalopathy. Her ammonia level is 68 and improving. Problem #5 elevated LFTs Problem #6 hypercalcemia: Her calcium has improved. Possibly from hematology malignancy as her 125 vitamin  D is high accompanied with low PTH and PTH related peptide. Problem #7 hypertension her blood pressure is reasonably controlled Problem #8 history of elevated IgG and lambda chain. Plan: Continue slow hydration We'll start patient on Lasix 40 mg IV twice a day Patient overall was poor prognosis and multiple medical problems and will benefit from a comfort measures.     LOS: 12 days   Jovonne Wilton S 03/25/2014,8:06 AM

## 2014-03-25 NOTE — Progress Notes (Signed)
I have seen and examined this patient and I agree with the above assessment and plan.  Costin Gherghe, MD [facility] [phone]  

## 2014-03-25 NOTE — Clinical Social Work Note (Signed)
Documents faxed to [facility], awaiting review.  Edwyna Shell, LCSW Clinical Social Worker [phone])

## 2014-03-25 NOTE — Progress Notes (Signed)
ANTIBIOTIC CONSULT NOTE - follow up  Pharmacy Consult for Vancomycin & Zosyn Indication: rule out sepsis  Allergies  Allergen Reactions  . Metoclopramide Hcl    Patient Measurements: Height: 5' 5.5" (166.4 cm) Weight: 148 lb (67.132 kg) IBW/kg (Calculated) : 58.15  Vital Signs: Temp: 98.5 F (36.9 C) (07/20 0609) Temp src: Oral (07/20 0609) BP: 117/45 mmHg (07/20 0609) Pulse Rate: 105 (07/20 0609) Intake/Output from previous day: 07/19 0701 - 07/20 0700 In: 1110 [I.V.:960; IV Piggyback:150] Out: 450 [Urine:450] Intake/Output from this shift:    Labs:  Recent Labs  03/23/14 0547 03/24/14 0632 03/25/14 0652  WBC 19.2* 18.5* 18.8*  HGB 9.5* 9.1* 9.0*  PLT 171 190 174  CREATININE 1.73* 1.65* 1.76*   Estimated Creatinine Clearance: 18 ml/min (by C-G formula based on Cr of 1.76). No results found for this basename: VANCOTROUGH, Corlis Leak, VANCORANDOM, GENTTROUGH, GENTPEAK, GENTRANDOM, TOBRATROUGH, TOBRAPEAK, TOBRARND, AMIKACINPEAK, AMIKACINTROU, AMIKACIN,  in the last 72 hours   Microbiology: Recent Results (from the past 720 hour(s))  URINE CULTURE     Status: None   Collection Time    03/13/14  4:45 PM      Result Value Ref Range Status   Specimen Description URINE, CLEAN CATCH   Final   Special Requests NONE   Final   Culture  Setup Time     Final   Value: 03/14/2014 04:28     Performed at [place]     Final   Value: 60,000 COLONIES/ML     Performed at [facility]   Culture     Final   Value: Multiple bacterial morphotypes present, none predominant. Suggest appropriate recollection if clinically indicated.     Performed at [facility]   Report Status 03/15/2014 FINAL   Final  MRSA PCR SCREENING     Status: None   Collection Time    03/13/14  7:30 PM      Result Value Ref Range Status   MRSA by PCR NEGATIVE  NEGATIVE Final   Comment:            The GeneXpert MRSA Assay (FDA     approved for NASAL specimens    only), is one component of a     comprehensive MRSA colonization     surveillance program. It is not     intended to diagnose MRSA     infection nor to guide or     monitor treatment for     MRSA infections.  CLOSTRIDIUM DIFFICILE BY PCR     Status: None   Collection Time    03/16/14  7:20 PM      Result Value Ref Range Status   C difficile by pcr NEGATIVE  NEGATIVE Final  URINE CULTURE     Status: None   Collection Time    03/19/14  5:15 PM      Result Value Ref Range Status   Specimen Description URINE, CLEAN CATCH   Final   Special Requests NONE   Final   Culture  Setup Time     Final   Value: 03/20/2014 14:28     Performed at [place]     Final   Value: >=100,000 COLONIES/ML     Performed at [facility]   Culture     Final   Value: Multiple bacterial morphotypes present, none predominant. Suggest appropriate recollection if clinically indicated.     Performed at  [facility]   Report Status 03/21/2014 FINAL   Final  CLOSTRIDIUM DIFFICILE BY PCR     Status: None   Collection Time    03/22/14 10:00 AM      Result Value Ref Range Status   C difficile by pcr NEGATIVE  NEGATIVE Final   Medical History: Past Medical History  Diagnosis Date  . Lumbar facet arthropathy   . Primary localized osteoarthrosis, hand   . Lumbosacral spondylosis without myelopathy   . Pain in joint, lower leg   . Hypertension   . Hyperlipidemia   . Palpitation   . Aortic valve stenosis   . Murmur 04/2012    2D Echo EF 65-70%, she did have moderate aortic stenosis with a mean gradient of 15, maximum gradient 21, and calculated aortic valve area of 1.05 cm2 to 1.1 cm2, she did have moderate tricuspid regurgitation and moderate pulmonary hypentension with a PA pressure of 41mm, there was trace of mild MR.  . Breast cancer    Medications:  Scheduled:  . amLODipine  5 mg Oral QHS  . feeding supplement (RESOURCE BREEZE)  1 Container Oral TID BM  .  furosemide  40 mg Intravenous BID  . megestrol  800 mg Oral Daily  . pantoprazole  40 mg Oral Daily  . piperacillin-tazobactam (ZOSYN)  IV  2.25 g Intravenous 4 times per day  . rifaximin  550 mg Oral BID  . sodium chloride  10-40 mL Intracatheter Q12H  . sodium chloride  3 mL Intravenous Q12H  . vancomycin  1,000 mg Intravenous Q48H   Assessment: 80 yo F who has been hospitalized since 03/13/14.  Her condition is deteriorating on IV Rocephin.  AMS worsened, WBC increasing.  No source of infection identified.  Currently afebrile.   Antibiotics are being broadened to Vanc & Zosyn  Acute renal failure noted.  Estimated CrCl 15-20 ml/min today.   Rocephin 03/20/14>>>7/18  Vancomycin 7/18 >>  Zosyn 7/18 >>  Goal of Therapy:  Vancomycin trough level 15-20 mcg/ml  Plan:  Zosyn 2.25gm IV q6h Vancomycin 1gm IV q48h Check Vancomycin trough at steady state Monitor renal function and cx data   Hart Robinsons A 03/25/2014,10:58 AM

## 2014-03-25 NOTE — Progress Notes (Signed)
[facility] PROGRESS NOTE  SWAYZE KOZUCH HCW:[id number] DOB: [birth year] DOA: 03/13/2014 PCP: Purvis Kilts, MD  Assessment/Plan: Acute encephalopathy on baseline chronic dementia - continues to gradually decline. This is likely multifactorial, patient with chronic cognitive deficits had a fall few days prior to admission, likely hypercalcemia contributing, weakness from prolonged hospitalization, presumed infection from an unknown origin and progressive renal failure. Her mental status is continuing to get worse despite aggressive treatment with IV antibiotics and multiple imaging modalities and labs to identify a clear reversible etiology. Her ammonia level was a bit elevated and Rifaximin was started however that didn't seem to help. Suspect underlying malignancy and rapid progression of her underlying dementia.  - discussed with daughter she is deteriorating and has a very poor short term prognosis  - Concern  that she may not survive this hospitalization.  Hypercalcemia, continues with symptoms of confusion, generalized weakness, lethargy, decreased appetite. Calcium level stable status post pamidronate 7/10. Suspect secondary to malignancy. CT abdomen/pelvis and chest with anasarca and soft tissue nodularity. Prominent left lower quadrant abdominal subcutaneous soft tissue nodule and possible right lower lobe nodule. US guided biopsy of abdominal subcutaneous nodules pending.  Acute renal failure. Suspect chronic component as well, continues to  worsen despite small bolus and Lasix.  - nephrology seeing as well who changed fluids 03/24/14 Elevated LFTs, stable. Ultrasound of the liver showed diffuse fatty infiltration which may partially explain findings.  History of breast cancer treated with lumpectomy, no chemotherapy or radiation, perhaps 10 years ago.  Dementia: short term memory issue.  Leukocytosis: trending upward still, stable    Code Status: DNR Family Communication:  caregiver at bedside Disposition Plan: will like need placement or residential hospice   Consultants:  nephrology  Procedures:  Biopsy abdominal nodules  Antibiotics: Rocephin 03/20/14>>>7/18  Vancomycin 7/18 >>  Zosyn 7/18 >>   HPI/Subjective: Somnolent, difficult to arouse  Objective: Filed Vitals:   03/25/14 0609  BP: 117/45  Pulse: 105  Temp: 98.5 F (36.9 C)  Resp: 20    Intake/Output Summary (Last 24 hours) at 03/25/14 1052 Last data filed at 03/25/14 0515  Gross per 24 hour  Intake   1110 ml  Output    450 ml  Net    660 ml   Filed Weights   03/13/14 1403  Weight: 67.132 kg (148 lb)    Exam:   General:  Well nourished, somnolent  Cardiovascular: RRR no MGR   Respiratory: normal effort BS somewhat coarse. No wheeze  Abdomen: obese soft +BS non-tender to palpation  Musculoskeletal: no clubbing or cyanosis   Data Reviewed: Basic Metabolic Panel:  Recent Labs Lab 03/21/14 0518 03/22/14 0618 03/23/14 0547 03/24/14 0632 03/25/14 0652  NA 137 139 141 140 143  K 3.7 3.9 3.6* 3.6* 3.7  CL 107 109 109 108 108  CO2 17* 16* 17* 17* 18*  GLUCOSE 92 95 98 95 84  BUN 44* 46* 49* 53* 60*  CREATININE 1.53* 1.58* 1.73* 1.65* 1.76*  CALCIUM 10.2 10.1 10.0 9.5 9.2   Liver Function Tests:  Recent Labs Lab 03/19/14 1139 03/23/14 0547  AST 55* 36  ALT 39* 26  ALKPHOS 287* 211*  BILITOT 0.8 0.7  PROT 4.7* 4.4*  ALBUMIN 2.2* 2.0*   No results found for this basename: LIPASE, AMYLASE,  in the last 168 hours  Recent Labs Lab 03/19/14 1139 03/24/14 1052  AMMONIA 76* 68*   CBC:  Recent Labs Lab 03/21/14 0518 03/22/14 0618 03/23/14 0547 03/24/14 1761  03/25/14 0652  WBC 16.0* 18.0* 19.2* 18.5* 18.8*  HGB 9.3* 9.5* 9.5* 9.1* 9.0*  HCT 27.4* 28.8* 29.6* 27.2* 27.4*  MCV 83.8 85.2 85.1 85.5 86.2  PLT 194 205 171 190 174   Cardiac Enzymes: No results found for this basename: CKTOTAL, CKMB, CKMBINDEX, TROPONINI,  in the last 168  hours BNP (last 3 results)  Recent Labs  03/13/14 1445  PROBNP 1434.0*   CBG:  Recent Labs Lab 03/20/14 1102  GLUCAP 88    Recent Results (from the past 240 hour(s))  CLOSTRIDIUM DIFFICILE BY PCR     Status: None   Collection Time    03/16/14  7:20 PM      Result Value Ref Range Status   C difficile by pcr NEGATIVE  NEGATIVE Final  URINE CULTURE     Status: None   Collection Time    03/19/14  5:15 PM      Result Value Ref Range Status   Specimen Description URINE, CLEAN CATCH   Final   Special Requests NONE   Final   Culture  Setup Time     Final   Value: 03/20/2014 14:28     Performed at [place]     Final   Value: >=100,000 COLONIES/ML     Performed at [facility]   Culture     Final   Value: Multiple bacterial morphotypes present, none predominant. Suggest appropriate recollection if clinically indicated.     Performed at [facility]   Report Status 03/21/2014 FINAL   Final  CLOSTRIDIUM DIFFICILE BY PCR     Status: None   Collection Time    03/22/14 10:00 AM      Result Value Ref Range Status   C difficile by pcr NEGATIVE  NEGATIVE Final     Studies: No results found.  Scheduled Meds: . amLODipine  5 mg Oral QHS  . feeding supplement (RESOURCE BREEZE)  1 Container Oral TID BM  . furosemide  40 mg Intravenous BID  . megestrol  800 mg Oral Daily  . pantoprazole  40 mg Oral Daily  . piperacillin-tazobactam (ZOSYN)  IV  2.25 g Intravenous 4 times per day  . rifaximin  550 mg Oral BID  . sodium chloride  10-40 mL Intracatheter Q12H  . sodium chloride  3 mL Intravenous Q12H  . vancomycin  1,000 mg Intravenous Q48H   Continuous Infusions: .  sodium bicarbonate infusion 1/4 NS 1000 mL 75 mL/hr at 03/25/14 8016    Principal Problem:   Acute renal failure Active Problems:   Diarrhea   GERD (gastroesophageal reflux disease)   Dehydration   Hypertension   Hyperlipidemia   Hyperkalemia   Abnormal CT of brain    Hypercalcemia   Acute encephalopathy   Elevated transaminase level    Time spent: 35 minutes    [facility] Pager [phone]. If 7PM-7AM, please contact night-coverage at [email or web address], password [facility] 03/25/2014, 10:52 AM  LOS: 12 days

## 2014-03-26 LAB — BASIC METABOLIC PANEL
Anion gap: 17 — ABNORMAL HIGH (ref 5–15)
BUN: 59 mg/dL — AB (ref 6–23)
CO2: 26 meq/L (ref 19–32)
Calcium: 7.6 mg/dL — ABNORMAL LOW (ref 8.4–10.5)
Chloride: 100 mEq/L (ref 96–112)
Creatinine, Ser: 1.81 mg/dL — ABNORMAL HIGH (ref 0.50–1.10)
GFR calc Af Amer: 26 mL/min — ABNORMAL LOW (ref >90)
GFR calc non Af Amer: 23 mL/min — ABNORMAL LOW (ref >90)
Glucose, Bld: 98 mg/dL (ref 70–99)
POTASSIUM: 2.9 meq/L — AB (ref 3.7–5.3)
SODIUM: 143 meq/L (ref 137–147)

## 2014-03-26 LAB — CBC
HEMATOCRIT: 23.7 % — AB (ref 36.0–46.0)
HEMOGLOBIN: 7.8 g/dL — AB (ref 12.0–15.0)
MCH: 28.6 pg (ref 26.0–34.0)
MCHC: 32.9 g/dL (ref 30.0–36.0)
MCV: 86.8 fL (ref 78.0–100.0)
Platelets: 120 10*3/uL — ABNORMAL LOW (ref 150–400)
RBC: 2.73 MIL/uL — ABNORMAL LOW (ref 3.87–5.11)
RDW: 24.5 % — ABNORMAL HIGH (ref 11.5–15.5)
WBC: 15.7 10*3/uL — AB (ref 4.0–10.5)

## 2014-03-26 MED ORDER — POTASSIUM CHLORIDE 20 MEQ/15ML (10%) PO LIQD
40.0000 meq | ORAL | Status: AC
  Administered 2014-03-26 (×2): 40 meq via ORAL
  Filled 2014-03-26 (×2): qty 30

## 2014-03-26 MED ORDER — LORAZEPAM 2 MG/ML PO CONC: 1.0000 mg | ORAL | Status: AC | PRN

## 2014-03-26 MED ORDER — SCOPOLAMINE 1 MG/3DAYS TD PT72: 1.0000 | MEDICATED_PATCH | TRANSDERMAL | Status: AC

## 2014-03-26 MED ORDER — RAMIPRIL 5 MG PO CAPS: ORAL_CAPSULE | ORAL | Status: DC

## 2014-03-26 MED ORDER — LACTULOSE 10 GM/15ML PO SOLN
10.0000 g | Freq: Three times a day (TID) | ORAL | Status: DC
  Administered 2014-03-26: 10 g via ORAL
  Filled 2014-03-26: qty 30

## 2014-03-26 MED ORDER — SCOPOLAMINE 1 MG/3DAYS TD PT72: 1.0000 | MEDICATED_PATCH | TRANSDERMAL | Status: DC

## 2014-03-26 MED ORDER — RAMIPRIL 5 MG PO CAPS: ORAL_CAPSULE | ORAL | Status: AC

## 2014-03-26 MED ORDER — MORPHINE SULFATE 20 MG/5ML PO SOLN: 2.5000 mg | ORAL | Status: AC | PRN

## 2014-03-26 NOTE — Discharge Summary (Signed)
Physician Discharge Summary  [name] SJG:[id number] DOB: 06-20-1918 DOA: 03/13/2014  PCP: Bianca Kilts, MD  Admit date: 03/13/2014 Discharge date: 03/27/2014  Time spent: 40 minutes  Recommendations for Outpatient Follow-up:  1. Being discharge to [facility].  2.  Discharge Diagnoses:  Principal Problem:   Acute renal failure Active Problems:   Diarrhea   GERD (gastroesophageal reflux disease)   Dehydration   Hypertension   Hyperlipidemia   Hyperkalemia   Abnormal CT of brain   Hypercalcemia   Acute encephalopathy   Elevated transaminase level   Discharge Condition:   Diet recommendation: soft  Filed Weights   03/13/14 1403  Weight: 67.132 kg (148 lb)    History of present illness:  [name] is a 80 y.o. female with past medical history that includes hypertension, hyperlipidemia, aortic valve stenosis presents to the emergency department with chief complaint of weakness. Information  obtained from the patient and her daughter who was at the bedside. Initial workup in the emergency room reveals acute renal failure, hyperkalemia, hyponatremia, dehydration and anemia.  Daughter reported that patient fell 3 days prior. Patient reported mechanical fall and denied any loss of consciousness dizziness syncope or near-syncope. EMS was called and recommended she come to the hospital but she refused. Patient lives alone with caregivers but fall was not witnessed. The day after the fall she complained of some soreness and developed bruising on her back she maintained her mobility. The following day she became somewhat lethargic with intermittent confusion and not eating or drinking her normal amount. She was unable to bear weight . She denied chest pain palpitation headache visual disturbances numbness or tingling of extremities. She denied fever chills abdominal pain nausea vomiting diarrhea or constipation. She denied dysuria hematuria frequency or urgency.   Basic metabolic panel significant for sodium of 135, potassium 6.0 creatinine 1.99 calcium 12.2, proBNP 1434. Chest x-ray with no active cardiopulmonary disease. EKG with normal sinus rhythm and prolonged PR interval. CT of the head with Incidental detection of enlarging bone lesion of the right zygoma otherwise no acute change  At the time of my exam she alert and oriented to self and place she was hemodynamically stable she was afebrile and not hypoxic. In the emergency department she received 15 g of Kayexalate.   Hospital Course:  Acute encephalopathy on baseline chronic dementia - this is likely multifactorial, patient with chronic cognitive deficits had a fall few days prior to admission, likely hypercalcemia contributing, weakness from prolonged hospitalization, presumed infection from an unknown origin and progressive renal failure. Her mental status continued to get worse despite aggressive treatment with IV antibiotics and multiple imaging modalities and labs to identify a clear reversible etiology. Her ammonia level was a bit elevated and Rifaximin was started however that didn't seem to help. Underlying malignancy suspected as well as rapid progression of her underlying dementia. Discussed with daughter. Patient deteriorating and has a very poor short term prognosis. Residential hospice recommended.   Hypercalcemia, see #1. Patient continued with symptoms of confusion, generalized weakness, lethargy, decreased appetite. Calcium level stable status post pamidronate 7/10. Suspect secondary to malignancy. CT abdomen/pelvis and chest with anasarca and soft tissue nodularity. Prominent left lower quadrant abdominal subcutaneous soft tissue nodule and possible right lower lobe nodule. US guided biopsy of abdominal subcutaneous nodules pending.   Acute renal failure. Suspect chronic component as well, worsening despite IV fluids and and Lasix yesterday per nephrology recommendations.  At discharge  creatinine continues to trend up.  Urine output fair  Elevated LFTs, stable. Ultrasound of the liver showed diffuse fatty infiltration which may partially explain findings.   History of breast cancer treated with lumpectomy, no chemotherapy or radiation, perhaps 10 years ago.   Dementia: short term memory issue.   Leukocytosis: trending down at discharge. ?infectio from unknown source. Rocephin for 4 days then broad spectrum antibiotics for 4 days.   Diet: heart   Procedures: US guided biopsy of subcu abdominal nodules 03/21/14  Consultations:  nephrology  Discharge Exam: Filed Vitals:   03/27/14 0444  BP: 99/38  Pulse: 101  Temp: 98 F (36.7 C)  Resp: 20    General: pale non-communicative. Unresponsive  Cardiovascular: RRR No MGR  Respiratory: normal effort BS coarse  Discharge Instructions You were cared for by a hospitalist during your hospital stay. If you have any questions about your discharge medications or the care you received while you were in the hospital after you are discharged, you can call the unit and asked to speak with the hospitalist on call if the hospitalist that took care of you is not available. Once you are discharged, your primary care physician will handle any further medical issues. Please note that NO REFILLS for any discharge medications will be authorized once you are discharged, as it is imperative that you return to your primary care physician (or establish a relationship with a primary care physician if you do not have one) for your aftercare needs so that they can reassess your need for medications and monitor your lab values.     Medication List    STOP taking these medications       ALPRAZolam 0.25 MG tablet  Commonly known as:  XANAX     amLODipine 10 MG tablet  Commonly known as:  NORVASC     donepezil 10 MG tablet  Commonly known as:  ARICEPT     escitalopram 10 MG tablet  Commonly known as:  LEXAPRO     esomeprazole 20 MG  capsule  Commonly known as:  NEXIUM     ferrous sulfate dried 160 (50 FE) MG Tbcr SR tablet  Commonly known as:  SLOW FE     loperamide 2 MG capsule  Commonly known as:  IMODIUM     Melatonin 2.5 MG Caps     PROBIOTIC DAILY PO     SYSTANE OP     vitamin B-12 1000 MCG tablet  Commonly known as:  CYANOCOBALAMIN      TAKE these medications       acetaminophen 650 MG CR tablet  Commonly known as:  TYLENOL  Take 1,300 mg by mouth 3 (three) times daily.     lactulose 10 GM/15ML solution  Commonly known as:  CHRONULAC  Take 15 mLs (10 g total) by mouth 3 (three) times daily.     LORazepam 2 MG/ML concentrated solution  Commonly known as:  ATIVAN  Take 0.5 mLs (1 mg total) by mouth every 4 (four) hours as needed for anxiety.     morphine 20 MG/5ML solution  Take 0.6 mLs (2.4 mg total) by mouth every 2 (two) hours as needed for pain.     ramipril 5 MG capsule  Commonly known as:  ALTACE  Every 12 hours prn SBP >190     scopolamine 1 MG/3DAYS  Commonly known as:  TRANSDERM-SCOP  Place 1 patch (1.5 mg total) onto the skin every 3 (three) days.       Allergies  Allergen Reactions  .  Metoclopramide Hcl    Follow-up Information   Follow up with [facility].   Contact information:   [address] [place] [address] [phone]        The results of significant diagnostics from this hospitalization (including imaging, microbiology, ancillary and laboratory) are listed below for reference.    Significant Diagnostic Studies: Ct Abdomen Pelvis Wo Contrast  03/18/2014   CLINICAL DATA:  Anorexia, headache, nausea and vomiting. Elevated liver function tests. Post cholecystectomy.  EXAM: CT CHEST, ABDOMEN AND PELVIS WITHOUT CONTRAST  TECHNIQUE: Multidetector CT imaging of the chest, abdomen and pelvis was performed following the standard protocol without IV contrast.  COMPARISON:  Ultrasound abdomen 03/15/2014, CT abdomen/ pelvis 12/01/2012   FINDINGS: CT CHEST FINDINGS  Motion artifact degrades imaging. Small bilateral pleural effusions are noted with associated compressive atelectasis. Biapical pleural thickening noted. There is a 3 mm possible right lower lobe superior segment nodule image 32, not well seen due to motion.  Moderate atheromatous aortic calcification is noted. Coronary arterial calcification is present. Trace pericardial fluid. No mediastinal or hilar lymphadenopathy. There is a possible 2 cm mass within the subcutaneous fat adjacent to the left latissimus dorsi muscle, image 27.  CT ABDOMEN AND PELVIS FINDINGS  Small hiatal hernia noted with clips at the epigastrium. There is diffuse soft tissue anasarca as well as trace abdominal and pelvic free fluid. Within the subcutaneous fat, there are multiple soft tissue nodules identified, for example left lower quadrant nodule 1.6 x 1.1 cm image 112. Trace subcutaneous gas likely indicates injection site. There is also a 1.6 cm nodule at the superior aspect of the left buttock. These are new since the prior exam.  No significant change in 1.8 cm low-density pancreatic tail lesion, allowing for motion. The kidneys are poorly evaluated due to motion, with right upper pole possible renal cortical cyst again identified measuring 1.6 cm and probable left mid renal cortical cyst measuring 1.5 cm, accounting for nodularity in these areas and previously seen cysts in these regions. Unenhanced liver, spleen, adrenal glands are unremarkable. Mild infrarenal abdominal aortic ectasia is identified measuring 2.4 cm image 69. No lymphadenopathy is identified.  The bladder is decompressed but unremarkable. Uterus is normal in appearance. The ovaries are not discretely identified but there is no adnexal mass. Colonic diverticuli noted without evidence for diverticulitis. The appendix is not identified but there is no secondary evidence for acute appendicitis. Small pelvic free fluid is identified.  Disc  degenerative change noted in the spine. No lytic or sclerotic osseous lesion. Remote left proximal humeral fracture is identified. L2 compression deformity is reidentified.  IMPRESSION: New diffuse anasarca and soft tissue nodularity. The presence of intra-abdominal/pelvic fluid with anasarca suggests third spacing or hypoproteinemia. However, the soft tissue nodule component is more suspicious for possible malignancy. Does the patient have a history of melanoma? Allowing for extensive motion artifact, and lack of IV contrast, no primary malignancy is identified in the chest, abdomen, or pelvis. A dominant left lower quadrant abdominal subcutaneous soft tissue nodule could be amenable to ultrasound-guided biopsy.  These results will be called to the ordering clinician or representative by the Radiologist Assistant, and communication documented in the PACS or zVision Dashboard.   Electronically Signed   By: Conchita Paris M.D.   On: 03/18/2014 16:04   Dg Chest 2 View  03/13/2014   CLINICAL DATA:  pain  EXAM: CHEST  2 VIEW  COMPARISON:  Two-view chest 11/20/2013.  FINDINGS: The heart  size and mediastinal contours are within normal limits. Atherosclerotic calcifications within the aorta. Both lungs are clear. The visualized skeletal structures are unremarkable.  IMPRESSION: No active cardiopulmonary disease.   Electronically Signed   By: Margaree Mackintosh M.D.   On: 03/13/2014 16:06   Ct Head Wo Contrast  03/16/2014   CLINICAL DATA:  Fall, headache and posterior neck pain  EXAM: CT HEAD WITHOUT CONTRAST  CT CERVICAL SPINE WITHOUT CONTRAST  TECHNIQUE: Multidetector CT imaging of the head and cervical spine was performed following the standard protocol without intravenous contrast. Multiplanar CT image reconstructions of the cervical spine were also generated.  COMPARISON:  03/13/2014  FINDINGS: CT HEAD FINDINGS  Mild cortical volume loss noted with proportional ventricular prominence. Areas of periventricular white  matter hypodensity are most compatible with small vessel ischemic change. No acute hemorrhage, infarct, or mass lesion is identified. No midline shift. Remote left colonic and right external capsule lacunar infarcts are reidentified. Again noted is an expansile mass within an incompletely visualized portion of the right lateral orbital wall/ zygoma, unchanged since the most recent comparison exam. Evidence of scleral banding reidentified.  CT CERVICAL SPINE FINDINGS  C1 through the cervicothoracic junction is visualized in its entirety. Multilevel disc degenerative change with uncovertebral joint hypertrophy noted, most prominent from C3 through C7. Mild neural foraminal narrowing at these levels is identified, most prominent at C5-C6. Mild multilevel facet osteoarthritic change is noted. No fracture or dislocation. Mild motion artifact is present at multiple levels.  IMPRESSION: No acute intracranial finding. Expansile right zygoma/orbital wall bony mass reidentified as previously discussed in report 03/13/2014, likely a benign finding with nonaggressive features.  Multilevel mid cervical spine degenerative change without focal acute finding.   Electronically Signed   By: Conchita Paris M.D.   On: 03/16/2014 22:20   Ct Head Wo Contrast  03/13/2014   CLINICAL DATA:  Headache.  Weakness.  EXAM: CT HEAD WITHOUT CONTRAST  TECHNIQUE: Contiguous axial images were obtained from the base of the skull through the vertex without intravenous contrast.  COMPARISON:  12/03/2008 and multiple previous  FINDINGS: There is no evidence of acute infarction. There is generalized brain atrophy. No focal insult is seen affecting the brainstem or cerebellum. 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 There is extensive chronic small vessel disease affecting the cerebral hemispheric white matter. No large vessel territory infarction, mass lesion, hemorrhage,  hydrocephalus or extra-axial collection. No inflammatory sinus disease. No acute calvarial finding. 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 This measures approximately 2.4 cm in diameter presently. On the study 2010, this measured approximately 2.1 cm. In 2006, this was not visible. Therefore, this is behaving an an indolent fashion. The could represent a benign entity such as hemangioma of bone or an indolent malignancy, including chondrosarcoma. There is atherosclerotic calcification of the major vessels at the base of the brain.  IMPRESSION: No acute finding. Extensive chronic ischemic changes throughout the brain as outlined above.  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 See above discussion.   Electronically Signed   By: Nelson Chimes M.D.   On: 03/13/2014 16:34   Ct Chest Wo Contrast  03/18/2014   CLINICAL DATA:  Anorexia, headache, nausea and vomiting. Elevated liver function tests. Post cholecystectomy.  EXAM: CT CHEST, ABDOMEN AND PELVIS WITHOUT CONTRAST  TECHNIQUE: Multidetector CT imaging of the chest, abdomen and pelvis was performed following the standard protocol  without IV contrast.  COMPARISON:  Ultrasound abdomen 03/15/2014, CT abdomen/ pelvis 12/01/2012  FINDINGS: CT CHEST FINDINGS  Motion artifact degrades imaging. Small bilateral pleural effusions are noted with associated compressive atelectasis. Biapical pleural thickening noted. There is a 3 mm possible right lower lobe superior segment nodule image 32, not well seen due to motion.  Moderate atheromatous aortic calcification is noted. Coronary arterial calcification is present. Trace pericardial fluid. No mediastinal or hilar lymphadenopathy. There is a possible 2 cm mass within the subcutaneous fat adjacent to the left latissimus dorsi muscle, image 27.  CT ABDOMEN AND PELVIS FINDINGS  Small hiatal hernia noted with clips at the epigastrium. There is diffuse soft tissue anasarca as well as trace  abdominal and pelvic free fluid. Within the subcutaneous fat, there are multiple soft tissue nodules identified, for example left lower quadrant nodule 1.6 x 1.1 cm image 112. Trace subcutaneous gas likely indicates injection site. There is also a 1.6 cm nodule at the superior aspect of the left buttock. These are new since the prior exam.  No significant change in 1.8 cm low-density pancreatic tail lesion, allowing for motion. The kidneys are poorly evaluated due to motion, with right upper pole possible renal cortical cyst again identified measuring 1.6 cm and probable left mid renal cortical cyst measuring 1.5 cm, accounting for nodularity in these areas and previously seen cysts in these regions. Unenhanced liver, spleen, adrenal glands are unremarkable. Mild infrarenal abdominal aortic ectasia is identified measuring 2.4 cm image 69. No lymphadenopathy is identified.  The bladder is decompressed but unremarkable. Uterus is normal in appearance. The ovaries are not discretely identified but there is no adnexal mass. Colonic diverticuli noted without evidence for diverticulitis. The appendix is not identified but there is no secondary evidence for acute appendicitis. Small pelvic free fluid is identified.  Disc degenerative change noted in the spine. No lytic or sclerotic osseous lesion. Remote left proximal humeral fracture is identified. L2 compression deformity is reidentified.  IMPRESSION: New diffuse anasarca and soft tissue nodularity. The presence of intra-abdominal/pelvic fluid with anasarca suggests third spacing or hypoproteinemia. However, the soft tissue nodule component is more suspicious for possible malignancy. Does the patient have a history of melanoma? Allowing for extensive motion artifact, and lack of IV contrast, no primary malignancy is identified in the chest, abdomen, or pelvis. A dominant left lower quadrant abdominal subcutaneous soft tissue nodule could be amenable to ultrasound-guided  biopsy.  These results will be called to the ordering clinician or representative by the Radiologist Assistant, and communication documented in the PACS or zVision Dashboard.   Electronically Signed   By: Conchita Paris M.D.   On: 03/18/2014 16:04   Ct Cervical Spine Wo Contrast  03/16/2014   CLINICAL DATA:  Fall, headache and posterior neck pain  EXAM: CT HEAD WITHOUT CONTRAST  CT CERVICAL SPINE WITHOUT CONTRAST  TECHNIQUE: Multidetector CT imaging of the head and cervical spine was performed following the standard protocol without intravenous contrast. Multiplanar CT image reconstructions of the cervical spine were also generated.  COMPARISON:  03/13/2014  FINDINGS: CT HEAD FINDINGS  Mild cortical volume loss noted with proportional ventricular prominence. Areas of periventricular white matter hypodensity are most compatible with small vessel ischemic change. No acute hemorrhage, infarct, or mass lesion is identified. No midline shift. Remote left colonic and right external capsule lacunar infarcts are reidentified. Again noted is an expansile mass within an incompletely visualized portion of the right lateral orbital wall/ zygoma, unchanged since the most  recent comparison exam. Evidence of scleral banding reidentified.  CT CERVICAL SPINE FINDINGS  C1 through the cervicothoracic junction is visualized in its entirety. Multilevel disc degenerative change with uncovertebral joint hypertrophy noted, most prominent from C3 through C7. Mild neural foraminal narrowing at these levels is identified, most prominent at C5-C6. Mild multilevel facet osteoarthritic change is noted. No fracture or dislocation. Mild motion artifact is present at multiple levels.  IMPRESSION: No acute intracranial finding. Expansile right zygoma/orbital wall bony mass reidentified as previously discussed in report 03/13/2014, likely a benign finding with nonaggressive features.  Multilevel mid cervical spine degenerative change without  focal acute finding.   Electronically Signed   By: Conchita Paris M.D.   On: 03/16/2014 22:20   Mr Brain Wo Contrast  03/20/2014   CLINICAL DATA:  Gross cancer. Possible stroke. Mental status changes.  EXAM: MRI HEAD WITHOUT CONTRAST  TECHNIQUE: Multiplanar, multiecho pulse sequences of the brain and surrounding structures were obtained without intravenous contrast.  COMPARISON:  Head CT 03/16/2014  FINDINGS: The study Sever's from considerable motion degradation. No acute infarction is demonstrated. 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 There is old infarction in the right external capsule region and radiating white matter tracts. There is chronic small-vessel ischemic change throughout the hemispheric white matter. There is an old right parietal cortical infarction. No mass lesion, hemorrhage, hydrocephalus or extra-axial collection. No pituitary mass. No skull base lesion seen.  IMPRESSION: Significant motion degradation. No sign of acute or reversible process. Old ischemic changes throughout the brain as outlined above.   Electronically Signed   By: Nelson Chimes M.D.   On: 03/20/2014 12:50   US Abdomen Complete  03/15/2014   CLINICAL DATA:  Nausea and vomiting. Elevated liver function studies. Status post cholecystectomy and hiatal hernia repair.  EXAM: ULTRASOUND ABDOMEN COMPLETE  COMPARISON:  CT scan 12/01/2012.  Ultrasound abdomen 12/04/2008.  FINDINGS: Gallbladder:  Surgically absent.  Common bile duct:  Diameter: 5.6 mm  Liver:  There is diffuse increased echogenicity of the liver and decreased through transmission consistent with fatty infiltration. No focal lesions or biliary dilatation.  IVC:  Normal caliber  Pancreas:  Sonographically unremarkable. The 18 mm pancreatic tail cyst demonstrated on the prior CT scan is not well seen on the ultrasound.  Spleen:  Normal size and echogenicity without focal lesions.  Right Kidney:  Length: 10.1 cm. Normal renal cortical thickness and  echogenicity for age. Small renal cysts are noted. No hydronephrosis.  Left Kidney:  Length: 11.1 cm. Normal renal cortical thickness and echogenicity for age. Simple midpole cyst is noted. No hydronephrosis.  Abdominal aorta:  Atherosclerotic changes but no aneurysm.  Other findings:  Small amount of abdominal ascites is noted along with a right pleural effusion.  IMPRESSION: 1. Status postcholecystectomy.  No common bile duct dilatation. 2. Mild diffuse fatty infiltration of the liver. 3. Bilateral renal cysts. 4. Small amount of free abdominal fluid and right pleural effusion.   Electronically Signed   By: Kalman Jewels M.D.   On: 03/15/2014 15:21   US Abdomen Limited  03/20/2014   ADDENDUM REPORT: 03/20/2014 17:22  ADDENDUM: Additional images were obtained in the left pelvic subcutaneous tissues. There is a poorly defined heterogeneous nodular structure in the left iliac crest region that roughly measures 1.5 x 0.7 x 1.6 cm. There is another small hypoechoic nodular structure that measures up to 0.7 cm. There is an oval-shaped hyperechoic area that measures up to 1.1 cm and may represent  fat. There are additional ill-defined hypoechoic areas.  Impression: Scattered heterogeneous nodular structures in the subcutaneous tissues. Findings are similar to the recent CT exam from the 03/18/2014. These ill-defined subcutaneous structures are nonspecific.   Electronically Signed   By: Markus Daft M.D.   On: 03/20/2014 17:22   03/20/2014   CLINICAL DATA:  Possible nodules on left lower quadrant.  EXAM: LIMITED ABDOMINAL ULTRASOUND  COMPARISON:  CT scan of March 18, 2014.  FINDINGS: Limited sonographic evaluation was performed of the left lower quadrant of the abdomen. No definite nodules, masses or fluid collection were identified in this area.  IMPRESSION: No definite mass or nodules are identified in the left lower quadrant of the abdomen by ultrasound.  Electronically Signed: By: Sabino Dick M.D. On: 03/20/2014  13:29   US Biopsy  03/21/2014   CLINICAL DATA:  80 year old with nodular structures in the subcutaneous tissues on recent CT imaging. Tissue diagnosis has been requested.  EXAM: ULTRASOUND-GUIDED BIOPSY OF A SUBCUTANEOUS NODULE / TISSUE  Physician: Stephan Minister. Anselm Pancoast, MD  FLUOROSCOPY TIME:  None  MEDICATIONS: None  ANESTHESIA/SEDATION: Moderate sedation time: None  PROCEDURE: Informed consent was obtained for an ultrasound-guided biopsy. The area of concern is in the left lower abdominal and pelvic subcutaneous tissues. The CT examination from 03/18/2014 was reviewed. Some of these nodular structures appear to represent normal appearing lymph nodes, particularly along the anterior left pelvic region. There is an oval-shaped structure in the left lateral subcutaneous tissues that appears correspond with one of the larger nodular structures on the recent CT. This area was targeted for biopsy. The skin was prepped and draped in sterile fashion. Skin was anesthetized with 1% lidocaine. An 18 gauge core needle was directed into this lesion and core biopsies obtained. A total of 5 core biopsies were obtained. Four of the samples were placed in formalin and one was placed in saline. Bandage placed over the puncture site.  FINDINGS: Heterogeneous and slightly hyperechoic nodular structure in the left lower abdominal subcutaneous tissues. Ultrasound characteristics are nonspecific. This could represent focal fat with surrounding edema. Difficult to exclude a subcutaneous nodule. Needle position was confirmed within this tissue.  COMPLICATIONS: None  IMPRESSION: Ultrasound-guided core biopsies of a nodular subcutaneous structure.   Electronically Signed   By: Markus Daft M.D.   On: 03/21/2014 21:52   Dg Chest Port 1 View  03/22/2014   CLINICAL DATA:  Shortness of breath and fever  EXAM: PORTABLE CHEST - 1 VIEW  COMPARISON:  Chest radiograph 03/13/2014, chest CT 03/18/2014  FINDINGS: Patient is rotated to the left. Small  pleural effusions are reidentified. Diffusely prominent interstitial reticular opacities are noted. Findings are slightly more prominent than previously. Heart size is normal. Remote left humeral fracture reidentified.  IMPRESSION: Increased pleural effusions and possible interstitial edema.   Electronically Signed   By: Conchita Paris M.D.   On: 03/22/2014 11:00    Microbiology: Recent Results (from the past 240 hour(s))  URINE CULTURE     Status: None   Collection Time    03/19/14  5:15 PM      Result Value Ref Range Status   Specimen Description URINE, CLEAN CATCH   Final   Special Requests NONE   Final   Culture  Setup Time     Final   Value: 03/20/2014 14:28     Performed at [facility]     Final   Value: >=100,000 COLONIES/ML  Performed at [facility]     Final   Value: Multiple bacterial morphotypes present, none predominant. Suggest appropriate recollection if clinically indicated.     Performed at [facility]   Report Status 03/21/2014 FINAL   Final  CLOSTRIDIUM DIFFICILE BY PCR     Status: None   Collection Time    03/22/14 10:00 AM      Result Value Ref Range Status   C difficile by pcr NEGATIVE  NEGATIVE Final     Labs: Basic Metabolic Panel:  Recent Labs Lab 03/23/14 0547 03/24/14 0632 03/25/14 0652 03/26/14 0550 03/27/14 0546  NA 141 140 143 143 146  K 3.6* 3.6* 3.7 2.9* 3.6*  CL 109 108 108 100 104  CO2 17* 17* 18* 26 24  GLUCOSE 98 95 84 98 104*  BUN 49* 53* 60* 59* 68*  CREATININE 1.73* 1.65* 1.76* 1.81* 2.09*  CALCIUM 10.0 9.5 9.2 7.6* 8.0*   Liver Function Tests:  Recent Labs Lab 03/23/14 0547  AST 36  ALT 26  ALKPHOS 211*  BILITOT 0.7  PROT 4.4*  ALBUMIN 2.0*   No results found for this basename: LIPASE, AMYLASE,  in the last 168 hours  Recent Labs Lab 03/24/14 1052  AMMONIA 68*   CBC:  Recent Labs Lab 03/22/14 0618 03/23/14 0547 03/24/14 0632 03/25/14 0652  03/26/14 0550  WBC 18.0* 19.2* 18.5* 18.8* 15.7*  HGB 9.5* 9.5* 9.1* 9.0* 7.8*  HCT 28.8* 29.6* 27.2* 27.4* 23.7*  MCV 85.2 85.1 85.5 86.2 86.8  PLT 205 171 190 174 120*   Cardiac Enzymes: No results found for this basename: CKTOTAL, CKMB, CKMBINDEX, TROPONINI,  in the last 168 hours BNP: BNP (last 3 results)  Recent Labs  03/13/14 1445  PROBNP 1434.0*   CBG:  Recent Labs Lab 03/20/14 1102  GLUCAP 88       Signed:  Nitisha Civello M  [facility] 03/27/2014, 10:38 AM

## 2014-03-26 NOTE — Clinical Social Work Note (Signed)
CSW spoke w daughter by phone, provided support re decision to transition to hospice home.  Daughter had questions regarding medications and treatments available to patient at hospice home.  Advised to discuss w hospice home rep, daughter agreeable.  Daughter would like call from MD and RN regarding current patient condition.    Edwyna Shell, LCSW Clinical Social Worker [phone])

## 2014-03-26 NOTE — Clinical Social Work Note (Signed)
Cindy, Hospice Home CSW, states she is meeting w daughter at [facility] approx 4 PM today.  If family is agreeable, patient can transfer to hospice home after their meeting.  Edwyna Shell, LCSW Clinical Social Worker [phone])

## 2014-03-26 NOTE — Progress Notes (Signed)
[facility] PROGRESS NOTE  NEAVE LENGER WNU:[id number] DOB: Mar 20, 1918 DOA: 03/13/2014 PCP: Purvis Kilts, MD  Assessment/Plan: Acute encephalopathy on baseline chronic dementia - More alert today.  This is likely multifactorial, patient with chronic cognitive deficits had a fall few days prior to admission, likely hypercalcemia contributing, weakness from prolonged hospitalization, presumed infection from an unknown origin and progressive renal failure. Her ammonia level was a bit elevated and Rifaximin was started however that didn't seem to help. Suspect underlying malignancy and rapid progression of her underlying dementia. Will provide continue IV antibiotics which will be discontinued on discharge.  Hypercalcemia, continues with symptoms of confusion, generalized weakness, lethargy, decreased appetite. Calcium level stable status post pamidronate 7/10. Suspect secondary to malignancy. CT abdomen/pelvis and chest with anasarca and soft tissue nodularity. Prominent left lower quadrant abdominal subcutaneous soft tissue nodule and possible right lower lobe nodule. US guided biopsy of abdominal subcutaneous nodules.  Acute renal failure. Suspect chronic component as well, continues to worsen despite small bolus and Lasix. Nephrology seeing as well who changed fluids 03/24/14   Elevated LFTs, stable. Ultrasound of the liver showed diffuse fatty infiltration which may partially explain findings.   History of breast cancer treated with lumpectomy, no chemotherapy or radiation, perhaps 10 years ago.   Dementia: short term memory issue. More alert today. Requesting water  Leukocytosis: trending down slightly. stable    Code Status: DNR Family Communication: daughter per phone Disposition Plan: home hospice or snf   Consultants:  Nephrology   Procedures: Biopsy abdominal nodules   Antibiotics: Rocephin 03/20/14>>>7/18  Vancomycin 7/18 >>  Zosyn 7/18  >>  HPI/Subjective: More alert. Eyes open, requesting water. Able to state name  Objective: Filed Vitals:   03/26/14 0521  BP: 116/42  Pulse: 104  Temp: 97.8 F (36.6 C)  Resp: 20    Intake/Output Summary (Last 24 hours) at 03/26/14 1126 Last data filed at 03/26/14 0513  Gross per 24 hour  Intake    100 ml  Output    850 ml  Net   -750 ml   Filed Weights   03/13/14 1403  Weight: 67.132 kg (148 lb)    Exam:   General:  Somewhat pale NAD  Cardiovascular: RRR No MGR no LE edema  Respiratory: normal effort somewhat coarse weak cough effort  Abdomen: soft +BS non-tender non-distended  Musculoskeletal: no clubbing no cyanosis   Data Reviewed: Basic Metabolic Panel:  Recent Labs Lab 03/22/14 0618 03/23/14 0547 03/24/14 0632 03/25/14 0652 03/26/14 0550  NA 139 141 140 143 143  K 3.9 3.6* 3.6* 3.7 2.9*  CL 109 109 108 108 100  CO2 16* 17* 17* 18* 26  GLUCOSE 95 98 95 84 98  BUN 46* 49* 53* 60* 59*  CREATININE 1.58* 1.73* 1.65* 1.76* 1.81*  CALCIUM 10.1 10.0 9.5 9.2 7.6*   Liver Function Tests:  Recent Labs Lab 03/19/14 1139 03/23/14 0547  AST 55* 36  ALT 39* 26  ALKPHOS 287* 211*  BILITOT 0.8 0.7  PROT 4.7* 4.4*  ALBUMIN 2.2* 2.0*   No results found for this basename: LIPASE, AMYLASE,  in the last 168 hours  Recent Labs Lab 03/19/14 1139 03/24/14 1052  AMMONIA 76* 68*   CBC:  Recent Labs Lab 03/22/14 0618 03/23/14 0547 03/24/14 0632 03/25/14 0652 03/26/14 0550  WBC 18.0* 19.2* 18.5* 18.8* 15.7*  HGB 9.5* 9.5* 9.1* 9.0* 7.8*  HCT 28.8* 29.6* 27.2* 27.4* 23.7*  MCV 85.2 85.1 85.5 86.2 86.8  PLT 205 171  190 174 120*   Cardiac Enzymes: No results found for this basename: CKTOTAL, CKMB, CKMBINDEX, TROPONINI,  in the last 168 hours BNP (last 3 results)  Recent Labs  03/13/14 1445  PROBNP 1434.0*   CBG:  Recent Labs Lab 03/20/14 1102  GLUCAP 88    Recent Results (from the past 240 hour(s))  CLOSTRIDIUM DIFFICILE BY PCR      Status: None   Collection Time    03/16/14  7:20 PM      Result Value Ref Range Status   C difficile by pcr NEGATIVE  NEGATIVE Final  URINE CULTURE     Status: None   Collection Time    03/19/14  5:15 PM      Result Value Ref Range Status   Specimen Description URINE, CLEAN CATCH   Final   Special Requests NONE   Final   Culture  Setup Time     Final   Value: 03/20/2014 14:28     Performed at [facility]     Final   Value: >=100,000 COLONIES/ML     Performed at [facility]   Culture     Final   Value: Multiple bacterial morphotypes present, none predominant. Suggest appropriate recollection if clinically indicated.     Performed at [facility]   Report Status 03/21/2014 FINAL   Final  CLOSTRIDIUM DIFFICILE BY PCR     Status: None   Collection Time    03/22/14 10:00 AM      Result Value Ref Range Status   C difficile by pcr NEGATIVE  NEGATIVE Final     Studies: No results found.  Scheduled Meds: . amLODipine  5 mg Oral QHS  . feeding supplement (RESOURCE BREEZE)  1 Container Oral TID BM  . furosemide  40 mg Intravenous BID  . megestrol  800 mg Oral Daily  . pantoprazole  40 mg Oral Daily  . piperacillin-tazobactam (ZOSYN)  IV  2.25 g Intravenous 4 times per day  . rifaximin  550 mg Oral BID  . sodium chloride  10-40 mL Intracatheter Q12H  . sodium chloride  3 mL Intravenous Q12H  . vancomycin  1,000 mg Intravenous Q48H   Continuous Infusions: .  sodium bicarbonate infusion 1/4 NS 1000 mL 75 mL/hr at 03/25/14 1952    Principal Problem:   Acute renal failure Active Problems:   Diarrhea   GERD (gastroesophageal reflux disease)   Dehydration   Hypertension   Hyperlipidemia   Hyperkalemia   Abnormal CT of brain   Hypercalcemia   Acute encephalopathy   Elevated transaminase level    Time spent: 35 minutes    [facility] Pager [phone]. If 7PM-7AM, please contact night-coverage at  [email or web address], password [facility] 03/26/2014, 11:26 AM  LOS: 13 days     I have seen and examined this patient and I agree with the above assessment and plan.  Marzetta Board, MD [facility] [phone]

## 2014-03-26 NOTE — Evaluation (Signed)
Clinical/Bedside Swallow Evaluation  [name] Details  Name: [name] MRN: [id number] Date of Birth: Sep 06, 1918  Today's Date: 03/26/2014 Time: 2409-7353 SLP Time Calculation (min): 22 min  Past Medical History:  Past Medical History  Diagnosis Date  . Lumbar facet arthropathy   . Primary localized osteoarthrosis, hand   . Lumbosacral spondylosis without myelopathy   . Pain in joint, lower leg   . Hypertension   . Hyperlipidemia   . Palpitation   . Aortic valve stenosis   . Murmur 04/2012    2D Echo EF 65-70%, she did have moderate aortic stenosis with a mean gradient of 15, maximum gradient 21, and calculated aortic valve area of 1.05 cm2 to 1.1 cm2, she did have moderate tricuspid regurgitation and moderate pulmonary hypentension with a PA pressure of 40mm, there was trace of mild MR.  . Breast cancer    Past Surgical History:  Past Surgical History  Procedure Laterality Date  . Abdominal hysterectomy    . Spine surgery    . Breast surgery    . Appendectomy  50 yrs ago  . Cholecystectomy    . Hiatal hernia repair    . Tonsillectomy  69yrs ago   HPI:  [name] is a 80 y.o. female with past medical history that includes hypertension, hyperlipidemia, aortic valve stenosis presented to the emergency department with chief complaint of weakness. Initial workup in the emergency room reveals acute renal failure, hyperkalemia, hyponatremia, dehydration and anemia. Daughter reports that [name] fell 3 days prior to admission. [name] reports mechanical fall and denies any loss of consciousness dizziness syncope or near-syncope. EMS was called and recommended she come to the hospital but she refused [name] lives alone with caregivers but fall was not witnessed. The day after the fall she complained of some soreness and developed bruising on her back she maintained her mobility. The following day she became somewhat lethargic with intermittent confusion and not eating or  drinking her normal amount. She was unable to bear weight and ambulates to the bathroom without assistance. Currently, acute encephalopathy on baseline chronic dementia - More alert today. This is likely multifactorial, [name] with chronic cognitive deficits had a fall few days prior to admission, likely hypercalcemia contributing, weakness from prolonged hospitalization, presumed infection from an unknown origin and progressive renal failure. Her ammonia level was a bit elevated and Rifaximin was started however that didn't seem to help. Suspect underlying malignancy and rapid progression of her underlying dementia. Will provide continue IV antibiotics. Concern that she may not survive this hospitalization.   Assessment / Plan / Recommendation Clinical Impression  [name] was seen at bedside for BSE. Her caregiver, Izora Gala was present and provided some background information. [name] was apparently very active and had a good appetite prior to her recent downturn. She reports that [name] was more alert today than she has been for the past week. [name] lethargic for SLP, but did rouse with cues and was able to follow some simple commands. She appears very weak and cued cough is weak. She was agreeable to only a few bites/sips (jello, magic cup, and sprite). She made a facial grimmace after trials and nods her head when asked if it is difficult for her to swallow. She waved her hand away when [employer] presented, but took a tiny bite. She tolerated straw sips thin without incident, however she is at risk for aspiration due to lethargy and weakness. [name] is not appropriate for MBSS at this time due  to lethargy and decreased willingness for intake. Would recommend continued attempts of puree and thin liquids with trained caregiver when [name] is alert and requesting. Caregiver was encouraged to always raise [facility] for all po presentations. SLP will follow up tomorrow for diet tolerance and caregiver training as needed.      Aspiration Risk  Mild    Diet Recommendation Dysphagia 1 (Puree);Thin liquid   Liquid Administration via: Cup;Straw Medication Administration: Whole meds with puree Supervision: Trained caregiver to feed [name];Full supervision/cueing for compensatory strategies Compensations: Slow rate;Small sips/bites;Check for pocketing;Follow solids with liquid Postural Changes and/or Swallow Maneuvers: Seated upright 90 degrees;Upright 30-60 min after meal    Other  Recommendations Oral Care Recommendations: Oral care BID;Staff/trained caregiver to provide oral care Other Recommendations: Clarify dietary restrictions   Follow Up Recommendations  24 hour supervision/assistance    Frequency and Duration min 2x/week  2 weeks   Pertinent Vitals/Pain VSS    SLP Swallow Goals   [name] will demonstrate safe and efficient consumption of least restrictive diet with use of strategies as needed.    Swallow Study Prior Functional Status   PTA lived alone with caregiver assist, good appetite and fairly active    General Date of Onset: 03/13/14 HPI: [name] is a 80 y.o. female with past medical history that includes hypertension, hyperlipidemia, aortic valve stenosis presented to the emergency department with chief complaint of weakness. Initial workup in the emergency room reveals acute renal failure, hyperkalemia, hyponatremia, dehydration and anemia. Daughter reports that [name] fell 3 days prior to admission. [name] reports mechanical fall and denies any loss of consciousness dizziness syncope or near-syncope. EMS was called and recommended she come to the hospital but she refused [name] lives alone with caregivers but fall was not witnessed. The day after the fall she complained of some soreness and developed bruising on her back she maintained her mobility. The following day she became somewhat lethargic with intermittent confusion and not eating or drinking her normal amount. She was unable  to bear weight and ambulates to the bathroom without assistance. Currently, acute encephalopathy on baseline chronic dementia - More alert today. This is likely multifactorial, [name] with chronic cognitive deficits had a fall few days prior to admission, likely hypercalcemia contributing, weakness from prolonged hospitalization, presumed infection from an unknown origin and progressive renal failure. Her ammonia level was a bit elevated and Rifaximin was started however that didn't seem to help. Suspect underlying malignancy and rapid progression of her underlying dementia. Will provide continue IV antibiotics. Concern that she may not survive this hospitalization. Type of Study: Bedside swallow evaluation Diet Prior to this Study: Regular;Thin liquids Temperature Spikes Noted: No History of Recent Intubation: No Behavior/Cognition: Lethargic Self-Feeding Abilities: Total assist [name] Positioning: Upright in bed Baseline Vocal Quality: Hoarse;Low vocal intensity Volitional Cough: Weak Volitional Swallow: Unable to elicit    Oral/Motor/Sensory Function Overall Oral Motor/Sensory Function: Impaired (difficult to formally assess due to [name] lethargy and AMS)   Ice Chips Ice chips: Not tested   Thin Liquid Thin Liquid: Within functional limits Presentation: Straw    Nectar Thick Nectar Thick Liquid: Not tested   Honey Thick Honey Thick Liquid: Not tested   Puree Puree: Impaired Presentation: Spoon Oral Phase Impairments: Reduced lingual movement/coordination;Impaired anterior to posterior transit Oral Phase Functional Implications: Prolonged oral transit;Oral holding;Oral residue   Solid      Solid: Not tested      Thank you,  Genene Churn, [place]  Adeoluwa Silvers 03/26/2014,4:46 PM

## 2014-03-26 NOTE — Clinical Social Work Note (Signed)
Hospice home has met w daughter and accepted patient for residential hospice.  Patient will transfer to facility tomorrow am.  Hospice has requested that PICC line be removed as they will not need it at their facility.  Edwyna Shell, LCSW Clinical Social Worker [phone])

## 2014-03-26 NOTE — Progress Notes (Signed)
Patient with critical lab of K 2.9 Dr. Cruzita Lederer notified.

## 2014-03-27 LAB — BASIC METABOLIC PANEL WITH GFR
Anion gap: 18 — ABNORMAL HIGH (ref 5–15)
BUN: 68 mg/dL — ABNORMAL HIGH (ref 6–23)
CO2: 24 meq/L (ref 19–32)
Calcium: 8 mg/dL — ABNORMAL LOW (ref 8.4–10.5)
Chloride: 104 meq/L (ref 96–112)
Creatinine, Ser: 2.09 mg/dL — ABNORMAL HIGH (ref 0.50–1.10)
GFR calc Af Amer: 22 mL/min — ABNORMAL LOW (ref >90)
GFR calc non Af Amer: 19 mL/min — ABNORMAL LOW (ref >90)
Glucose, Bld: 104 mg/dL — ABNORMAL HIGH (ref 70–99)
Potassium: 3.6 meq/L — ABNORMAL LOW (ref 3.7–5.3)
Sodium: 146 meq/L (ref 137–147)

## 2014-03-27 MED ORDER — LACTULOSE 10 GM/15ML PO SOLN: 10.0000 g | Freq: Three times a day (TID) | ORAL | Status: AC

## 2014-03-27 NOTE — Progress Notes (Signed)
Nutrition Brief Note  Chart reviewed. Pt now transitioning to Hospice care.  No further nutrition interventions warranted at this time.  Please re-consult as needed.   Colman Cater MS,RD,CSG,LDN Office: [phone] Pager: [phone]

## 2014-03-27 NOTE — Progress Notes (Signed)
PT Cancellation Note  Patient Details Name: [name] MRN: [id number] DOB: [birth year]   Cancelled Treatment:    Reason Eval/Treat Not Completed: Medical issues which prohibited therapy  Reviewed chart, noted pt being transitioned to hospice care.  Pt will be d/c from PT services at this time to continue with hospice care.     Peregrine Nolt 03/27/2014, 9:45 AM

## 2014-03-27 NOTE — Discharge Summary (Signed)
Patient was seen and examined. Her chart, vital signs, laboratory studies were reviewed. She was discussed with nurse practitioner, Ms. Renard Hamper. Agree with her assessment and plan. The patient completed 8 days of antibiotic therapy with no obvious source of infection. She is being transferred to hospice home of [facility] for end of life management and care.

## 2014-03-27 NOTE — Progress Notes (Signed)
SLP Cancellation Note  Patient Details Name: [name] MRN: [id number] DOB: [birth year]   Cancelled treatment:       Reason Eval/Treat Not Completed: Patient's level of consciousness. Was unable to awaken pt at bedside. Daughter reported pt was tolerating meal well yesterday but had not eaten yet this a.m. Moving to hospice this p.m. ST will sign off.   Kern Reap, MA, CCC-SLP 03/27/2014, 12:26 PM

## 2014-03-27 NOTE — Progress Notes (Signed)
UR chart review completed.  

## 2014-03-27 NOTE — Progress Notes (Signed)
Pt less responsive this morning than earlier in shift. Agonal breathing. O2 2L via [place] placed for comfort.  Cyanosis noted to toes and fingers.  Will continue to monitor.

## 2014-03-27 NOTE — Clinical Social Work Note (Signed)
Patient ready for discharge today, will transfer via EMS to [facility] approx 12:30 today.  Daughter in room aware and agreeable.  Hospice Home agreeable to transfer.  Discharge summary faxed to facility, discharge packet prepared and placed w shadow chart for transport.  CSW signing off as no further SW needs identified.  Edwyna Shell, LCSW Clinical Social Worker [phone])

## 2014-03-27 NOTE — Progress Notes (Signed)
Patient with orders to be discharge to hospice home. Discharge packet sent with patient. Report called to Maudie Mercury, Therapist, sports. Patient stable. Patient transported via EMS.

## 2014-04-06 DEATH — deceased

## 2014-06-24 ENCOUNTER — Ambulatory Visit (INDEPENDENT_AMBULATORY_CARE_PROVIDER_SITE_OTHER): Payer: Medicare Other | Admitting: Internal Medicine

## 2014-08-28 ENCOUNTER — Encounter (INDEPENDENT_AMBULATORY_CARE_PROVIDER_SITE_OTHER): Payer: Self-pay

## 2014-09-12 IMAGING — US US ABDOMEN COMPLETE
1 series · 13 of 25 positions shown · non-contrast
Comparison: CT scan 12/01/2012.  Ultrasound abdomen 12/04/2008.

CLINICAL DATA: Nausea and vomiting. Elevated liver function
studies. Status post cholecystectomy and hiatal hernia repair.

EXAM:
ULTRASOUND ABDOMEN COMPLETE

[Series 1: us abdomen complete · 0.16mm/px · 13 of 104 slices shown]
[im 1/104]
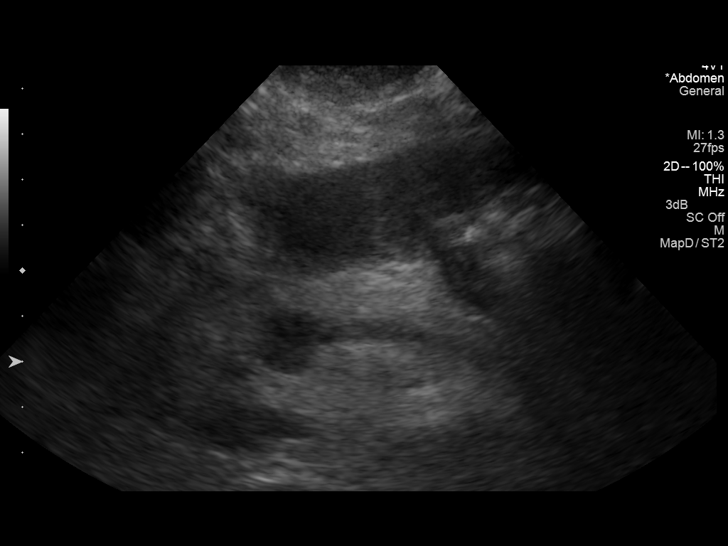
[im 9/104]
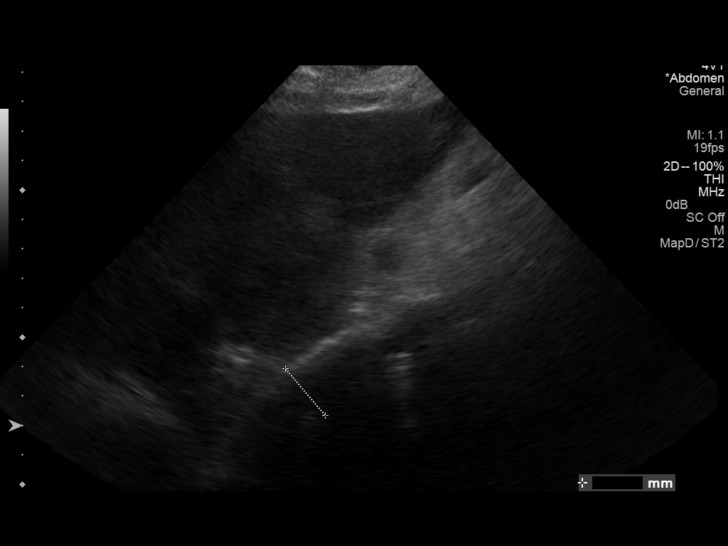
[im 18/104]
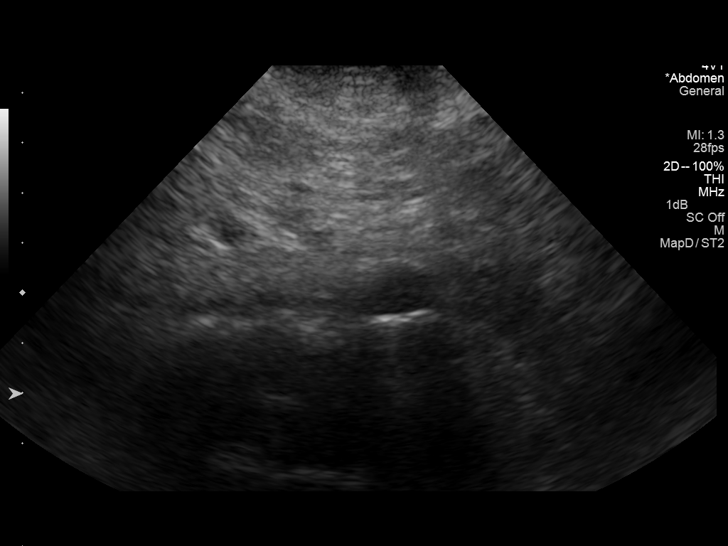
[im 26/104]
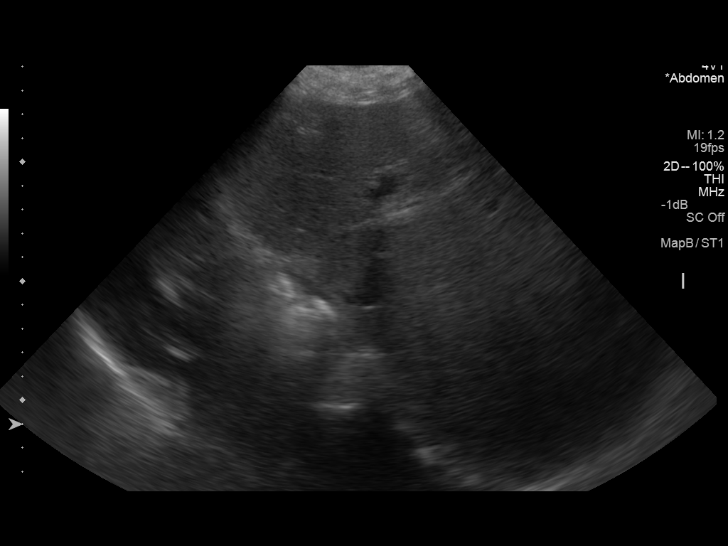
[im 35/104]
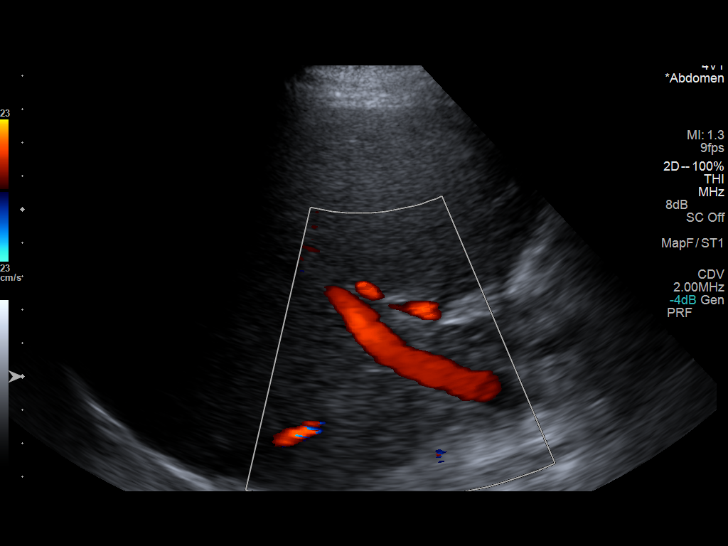
[im 43/104]
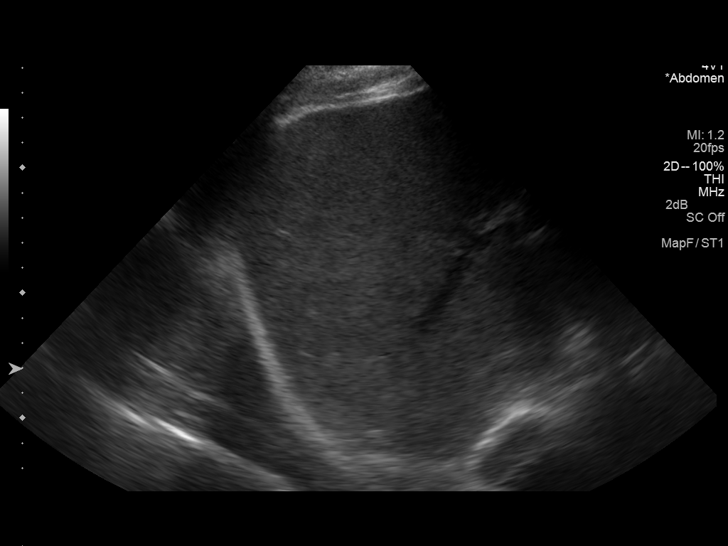
[im 52/104]
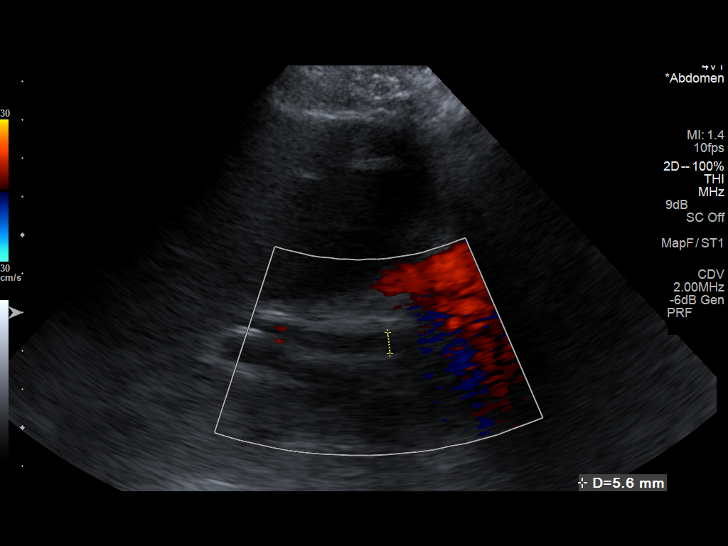
[im 61/104]
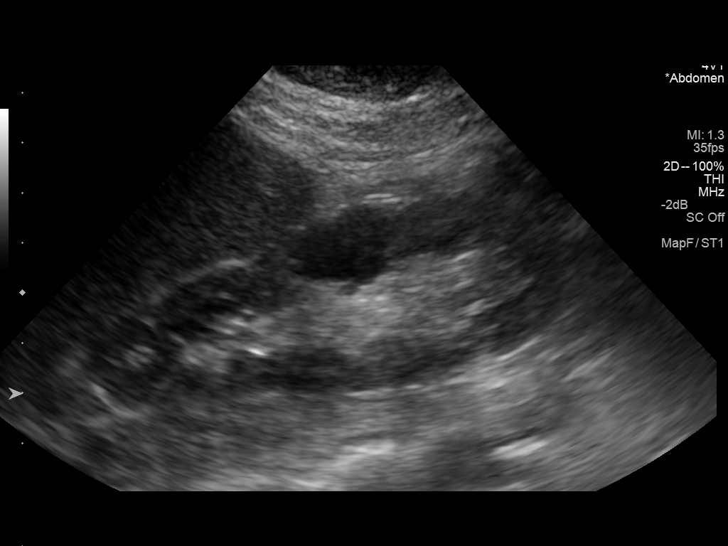
[im 69/104]
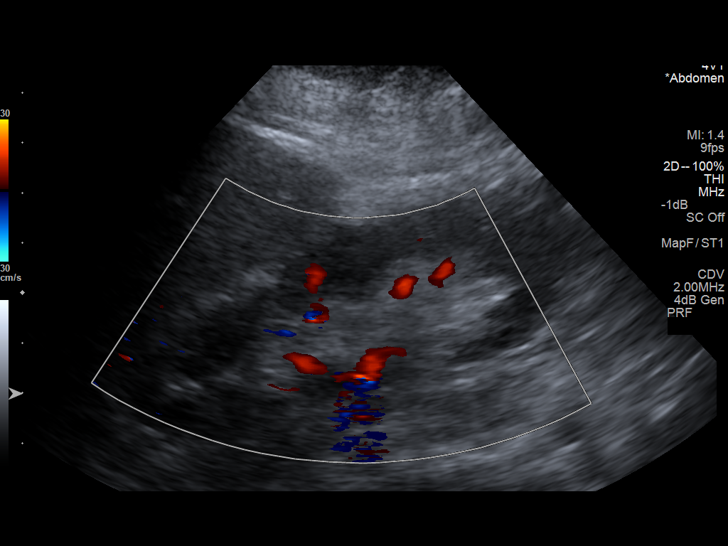
[im 78/104]
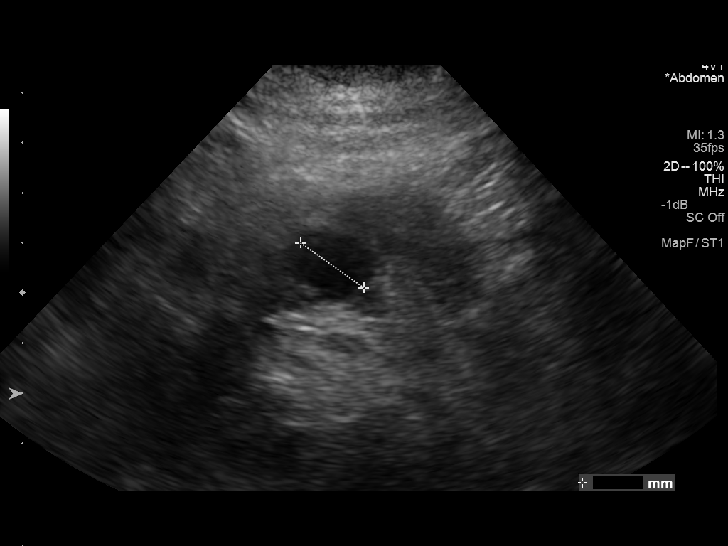
[im 86/104]
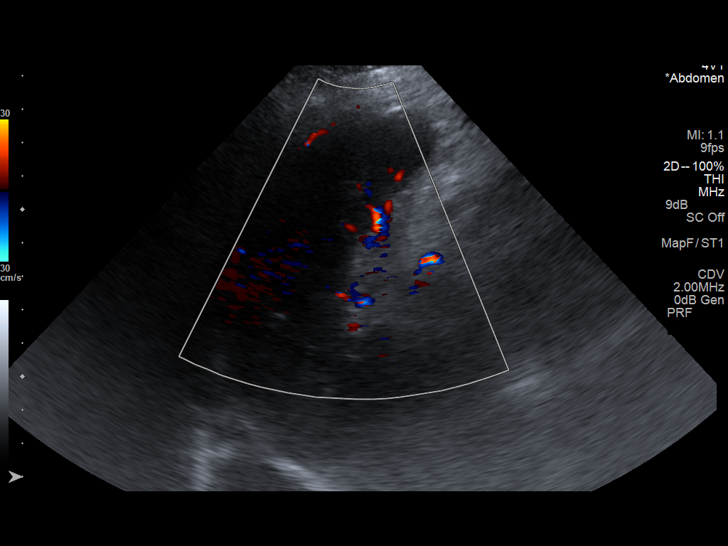
[im 95/104]
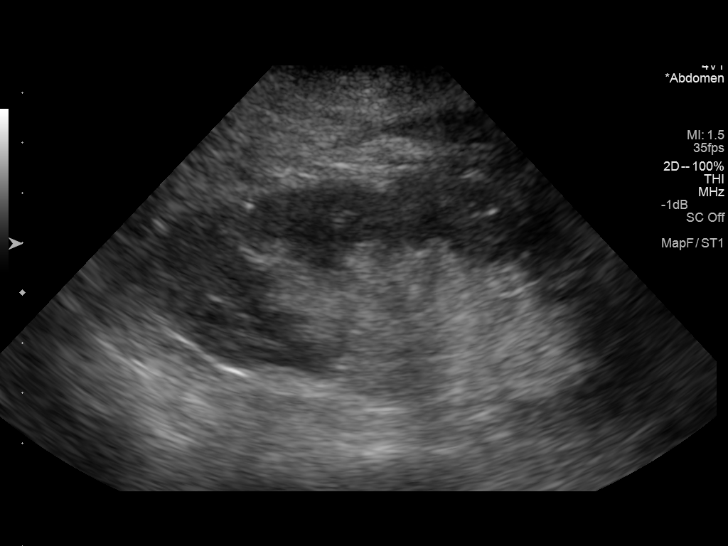
[im 104/104]
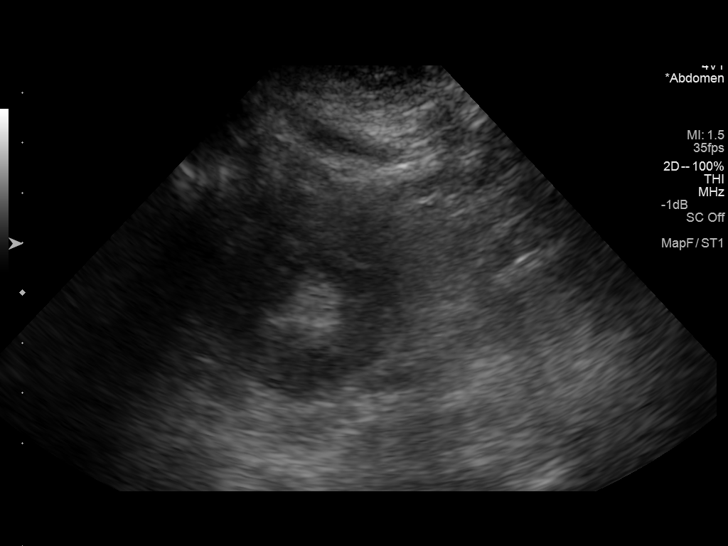

[13 of 25 positions shown; findings below may reference images not displayed]

FINDINGS: Gallbladder:

Surgically absent.

Common bile duct:

Diameter: 5.6 mm

Liver:

There is diffuse increased echogenicity of the liver and decreased
through transmission consistent with fatty infiltration. No focal
lesions or biliary dilatation.

IVC:

Normal caliber

Pancreas:

Sonographically unremarkable. The 18 mm pancreatic tail cyst
demonstrated on the prior CT scan is not well seen on the
ultrasound.

Spleen:

Normal size and echogenicity without focal lesions.

Right Kidney:

Length: 10.1 cm. Normal renal cortical thickness and echogenicity
for age. Small renal cysts are noted. No hydronephrosis.

Left Kidney:

Length: 11.1 cm. Normal renal cortical thickness and echogenicity
for age. Simple midpole cyst is noted. No hydronephrosis.

Abdominal aorta:

Atherosclerotic changes but no aneurysm.

Other findings:

Small amount of abdominal ascites is noted along with a right
pleural effusion.
IMPRESSION: 1. Status postcholecystectomy.  No common bile duct dilatation.
2. Mild diffuse fatty infiltration of the liver.
3. Bilateral renal cysts.
4. Small amount of free abdominal fluid and right pleural effusion.
